# Patient Record
Sex: Female | Born: 2005 | Race: White | Hispanic: No | Smoking: Never smoker
Health system: Southern US, Community
[De-identification: ages and names within clinical notes are randomized; demographics above are authoritative.]

## PROBLEM LIST (undated history)

## (undated) ENCOUNTER — Inpatient Hospital Stay: Payer: Self-pay

## (undated) ENCOUNTER — Ambulatory Visit: Admission: EM | Source: Home / Self Care

## (undated) DIAGNOSIS — F32A Depression, unspecified: Secondary | ICD-10-CM

## (undated) DIAGNOSIS — F419 Anxiety disorder, unspecified: Secondary | ICD-10-CM

## (undated) HISTORY — DX: Anxiety disorder, unspecified: F41.9

## (undated) HISTORY — DX: Depression, unspecified: F32.A

## (undated) HISTORY — PX: TONSILLECTOMY: SUR1361

---
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
## 2023-02-12 ENCOUNTER — Ambulatory Visit: Payer: BC Managed Care – PPO

## 2023-02-12 NOTE — Progress Notes (Deleted)
    NURSE VISIT NOTE  Subjective:    Patient ID: Christina Butler, female    DOB: 2006-10-13, 17 y.o.   MRN: [id number]  HPI  Patient is a 17 y.o. No obstetric history on file. female who presents for depo provera injection.   Objective:    There were no vitals taken for this visit.  Last Annual: 11/20/2022. Last pap: N/A. Last Depo-Provera: 11/20/2022. Side Effects if any: none. Serum HCG indicated? No . Depo-Provera 150 mg IM given by: Sheliah Hatch, CMA. Site: Left Deltoid  Lab Review  @THIS  VISIT ONLY@  Assessment:   No diagnosis found.   Plan:   Next appointment due between June 25 and July 9th.    Fonda Kinder, CMA

## 2023-02-16 ENCOUNTER — Other Ambulatory Visit: Payer: Self-pay

## 2023-02-16 ENCOUNTER — Emergency Department
Admission: EM | Admit: 2023-02-16 | Discharge: 2023-02-16 | Disposition: A | Discharge status: Home / Self Care | Payer: BC Managed Care – PPO | Attending: Emergency Medicine | Admitting: Emergency Medicine

## 2023-02-16 DIAGNOSIS — N12 Tubulo-interstitial nephritis, not specified as acute or chronic: Secondary | ICD-10-CM | POA: Diagnosis not present

## 2023-02-16 DIAGNOSIS — R300 Dysuria: Secondary | ICD-10-CM | POA: Diagnosis not present

## 2023-02-16 LAB — URINALYSIS, ROUTINE W REFLEX MICROSCOPIC
Bilirubin Urine: NEGATIVE
Glucose, UA: NEGATIVE mg/dL
Hgb urine dipstick: NEGATIVE
Ketones, ur: NEGATIVE mg/dL
Leukocytes,Ua: NEGATIVE
Nitrite: POSITIVE — AB
Protein, ur: NEGATIVE mg/dL
Specific Gravity, Urine: 1.014 (ref 1.005–1.030)
pH: 5 (ref 5.0–8.0)

## 2023-02-16 MED ORDER — CEPHALEXIN 500 MG PO CAPS: 500.0000 mg | ORAL_CAPSULE | Freq: Four times a day (QID) | ORAL | 0 refills | Status: DC

## 2023-02-16 MED ORDER — CEFTRIAXONE SODIUM 1 G IJ SOLR
1.0000 g | Freq: Once | INTRAMUSCULAR | Status: AC
  Administered 2023-02-16: 1 g via INTRAMUSCULAR
  Filled 2023-02-16: qty 10

## 2023-02-16 NOTE — ED Triage Notes (Signed)
Pt to ED via POV c.o UTI and flank pain. Pt states she's had UTI for 5 days and has been taking Azo for it and it would take pain away but pain comes back. Pt now reporting left and right sided flank pain. Denies N/V/D. NAD at this time

## 2023-02-16 NOTE — ED Provider Notes (Signed)
[facility] Provider Note  Patient Contact: 10:13 PM (approximate)   History   Urinary Tract Infection and Flank Pain   HPI  Christina Butler is a 17 y.o. female who presents the emergency department with dysuria and flank pain.  Patient states that she has had recurrent UTIs.  She seen her OB/GYN in the past for same.  She denies any fevers, chills, emesis, diarrhea or constipation.  Started off with dysuria, she was using Azo with no relief and now she is starting to have flank pain.     Physical Exam   Triage Vital Signs: ED Triage Vitals  Enc Vitals Group     BP 02/16/23 2131 (!) 151/85     Pulse Rate 02/16/23 2131 86     Resp 02/16/23 2131 16     Temp 02/16/23 2131 98.9 F (37.2 C)     Temp Source 02/16/23 2131 Oral     SpO2 02/16/23 2131 100 %     Weight 02/16/23 2128 156 lb (70.8 kg)     Height 02/16/23 2128  (1.676 m)     Head Circumference --      Peak Flow --      Pain Score 02/16/23 2133 6     Pain Loc --      Pain Edu? --      Excl. in GC? --     Most recent vital signs: Vitals:   02/16/23 2131  BP: (!) 151/85  Pulse: 86  Resp: 16  Temp: 98.9 F (37.2 C)  SpO2: 100%     General: Alert and in no acute distress.  Cardiovascular:  Good peripheral perfusion Respiratory: Normal respiratory effort without tachypnea or retractions. Lungs CTAB.  Gastrointestinal: Bowel sounds 4 quadrants. Soft and nontender to palpation. No guarding or rigidity. No palpable masses. No distention.  Mild left-sided CVA tenderness. Musculoskeletal: Full range of motion to all extremities.  Neurologic:  No gross focal neurologic deficits are appreciated.  Skin:   No rash noted Other:   ED Results / Procedures / Treatments   Labs (all labs ordered are listed, but only abnormal results are displayed) Labs Reviewed  URINALYSIS, ROUTINE W REFLEX MICROSCOPIC - Abnormal; Notable for the following components:      Result Value   Color, Urine  AMBER (*)    APPearance CLEAR (*)    Nitrite POSITIVE (*)    Bacteria, UA RARE (*)    All other components within normal limits  POC URINE PREG, ED     EKG     RADIOLOGY    No results found.  PROCEDURES:  Critical Care performed: No  Procedures   MEDICATIONS ORDERED IN ED: Medications  cefTRIAXone (ROCEPHIN) injection 1 g (has no administration in time range)     IMPRESSION / MDM / ASSESSMENT AND PLAN / ED COURSE  I reviewed the triage vital signs and the nursing notes.                                 Differential diagnosis includes, but is not limited to, pyelonephritis, nephrolithiasis, UTI, ectopic, pregnant   Patient's presentation is most consistent with acute presentation with potential threat to life or bodily function.   Patient's diagnosis is consistent with UTI/pyelonephritis.  Patient presents emergency department dysuria.  She has a history of recurring UTIs and follows with her OB for same.  Patient has developed some left-sided  flank pain.  No fevers, emesis, other concerning signs are vital signs.  Patient has mild left-sided CVA tenderness.  Nitrite positive urine consistent with UTI.  Patient will be given a Rocephin shot here followed by Keflex at home.  Return precautions discussed with patient.  Follow-up primary care as needed..  Patient is given ED precautions to return to the ED for any worsening or new symptoms.     FINAL CLINICAL IMPRESSION(S) / ED DIAGNOSES   Final diagnoses:  Pyelonephritis     Rx / DC Orders   ED Discharge Orders          Ordered    cephALEXin (KEFLEX) 500 MG capsule  4 times daily        02/16/23 2217             Note:  This document was prepared using Dragon voice recognition software and may include unintentional dictation errors.   Lanette Hampshire 02/16/23 2217    Minna Antis, MD 02/17/23 2257

## 2023-02-18 ENCOUNTER — Other Ambulatory Visit: Payer: Self-pay

## 2023-02-18 ENCOUNTER — Emergency Department
Admission: EM | Admit: 2023-02-18 | Discharge: 2023-02-18 | Disposition: A | Discharge status: Home / Self Care | Payer: BC Managed Care – PPO | Attending: Emergency Medicine | Admitting: Emergency Medicine

## 2023-02-18 ENCOUNTER — Encounter: Payer: Self-pay | Admitting: Emergency Medicine

## 2023-02-18 DIAGNOSIS — R102 Pelvic and perineal pain: Secondary | ICD-10-CM | POA: Diagnosis not present

## 2023-02-18 DIAGNOSIS — E876 Hypokalemia: Secondary | ICD-10-CM | POA: Diagnosis not present

## 2023-02-18 LAB — WET PREP, GENITAL
Clue Cells Wet Prep HPF POC: NONE SEEN
Sperm: NONE SEEN
Trich, Wet Prep: NONE SEEN
WBC, Wet Prep HPF POC: >=10 — AB (ref <10)
Yeast Wet Prep HPF POC: NONE SEEN

## 2023-02-18 LAB — CBC WITH DIFFERENTIAL/PLATELET
Abs Immature Granulocytes: 0.01 10*3/uL (ref 0.00–0.07)
Basophils Absolute: 0 10*3/uL (ref 0.0–0.1)
Basophils Relative: 1 %
Eosinophils Absolute: 0.1 10*3/uL (ref 0.0–0.5)
Eosinophils Relative: 1 %
HCT: 35.7 % — ABNORMAL LOW (ref 36.0–46.0)
Hemoglobin: 12.6 g/dL (ref 12.0–15.0)
Immature Granulocytes: 0 %
Lymphocytes Relative: 53 %
Lymphs Abs: 3.3 10*3/uL (ref 0.7–4.0)
MCH: 30.5 pg (ref 26.0–34.0)
MCHC: 35.3 g/dL (ref 30.0–36.0)
MCV: 86.4 fL (ref 80.0–100.0)
Monocytes Absolute: 0.4 10*3/uL (ref 0.1–1.0)
Monocytes Relative: 7 %
Neutro Abs: 2.4 10*3/uL (ref 1.7–7.7)
Neutrophils Relative %: 38 %
Platelets: 187 10*3/uL (ref 150–400)
RBC: 4.13 MIL/uL (ref 3.87–5.11)
RDW: 11.9 % (ref 11.5–15.5)
WBC: 6.2 10*3/uL (ref 4.0–10.5)
nRBC: 0 % (ref 0.0–0.2)

## 2023-02-18 LAB — CHLAMYDIA/NGC RT PCR (ARMC ONLY)
Chlamydia Tr: NOT DETECTED
N gonorrhoeae: NOT DETECTED

## 2023-02-18 LAB — BASIC METABOLIC PANEL
Anion gap: 7 (ref 5–15)
BUN: 11 mg/dL (ref 6–20)
CO2: 24 mmol/L (ref 22–32)
Calcium: 9 mg/dL (ref 8.9–10.3)
Chloride: 106 mmol/L (ref 98–111)
Creatinine, Ser: 0.74 mg/dL (ref 0.44–1.00)
GFR, Estimated: >60 mL/min (ref >60)
Glucose, Bld: 90 mg/dL (ref 70–99)
Potassium: 3.4 mmol/L — ABNORMAL LOW (ref 3.5–5.1)
Sodium: 137 mmol/L (ref 135–145)

## 2023-02-18 MED ORDER — PHENAZOPYRIDINE HCL 95 MG PO TABS: 190.0000 mg | ORAL_TABLET | Freq: Three times a day (TID) | ORAL | 0 refills | Status: DC | PRN

## 2023-02-18 MED ORDER — PHENAZOPYRIDINE HCL 200 MG PO TABS
200.0000 mg | ORAL_TABLET | Freq: Once | ORAL | Status: AC
  Administered 2023-02-18: 200 mg via ORAL
  Filled 2023-02-18: qty 1

## 2023-02-18 MED ORDER — KETOROLAC TROMETHAMINE 30 MG/ML IJ SOLN
15.0000 mg | Freq: Once | INTRAMUSCULAR | Status: AC
  Administered 2023-02-18: 15 mg via INTRAVENOUS
  Filled 2023-02-18: qty 1

## 2023-02-18 NOTE — ED Triage Notes (Signed)
Pt states seen yesterday and dx with UTI, pt states received abx and a shot here and picked up her abx yesterday and started taking as prescribed. Pt states flank pain is improved, but UTI symtpoms are not any better if not worse. Pt A&O x4, NAD noted in triage.

## 2023-02-18 NOTE — ED Provider Notes (Signed)
[facility] Provider Note    Event Date/Time   First MD Initiated Contact with Patient 02/18/23 0402     (approximate)   History   Flank Pain   HPI Christina Butler is a 17 y.o. female who presents for persistent vulvar vaginal pain in the setting of a recent UTI diagnosis.  She was seen about 2 days ago in the emergency department and diagnosed with urinary tract infection, given a dose of ceftriaxone intramuscular, and filled her prescription for Keflex.  She has taken about 4 doses of Keflex, but she said that the pain "in the front", both when she urinates and when she does not, is excruciating.  She has not taken any Azo this time, but in the past she has taken Azo with good effect.  She was not sure if she was supposed to take it along with the antibiotics.  She says she is not having any additional flank pain and has not had any fever or persistent vomiting, it is just the pain in the genital region that is causing her much distress.     Physical Exam   Triage Vital Signs: ED Triage Vitals [02/18/23 0357]  Enc Vitals Group     BP 121/75     Pulse Rate 75     Resp 17     Temp 98.6 F (37 C)     Temp Source Oral     SpO2 99 %     Weight 70.8 kg (156 lb)     Height 1.676 m (5\' 6" )     Head Circumference      Peak Flow      Pain Score 8     Pain Loc      Pain Edu?      Excl. in GC?     Most recent vital signs: Vitals:   02/18/23 0357  BP: 121/75  Pulse: 75  Resp: 17  Temp: 98.6 F (37 C)  SpO2: 99%    General: Awake, no distress.  Well-appearing. CV:  Good peripheral perfusion.  Resp:  Normal effort. Speaking easily and comfortably, no accessory muscle usage nor intercostal retractions.   Abd:  No distention.  No tenderness to palpation other than some very mild tenderness in the suprapubic region.  No rebound or guarding. GU:  Normal external exam with no evidence of rash including herpetic lesions.  No tenderness to palpation along  the labia majora and labia minora.  No visible nor palpable abscesses no areas of induration or fluctuance.  Speculum exam is essentially normal with minimal discharge in the vaginal vault, normal-appearing cervix, and the patient had no cervical motion tenderness on bimanual exam.  ED chaperone present throughout exam Marchelle Folks, ED RN).   ED Results / Procedures / Treatments   Labs (all labs ordered are listed, but only abnormal results are displayed) Labs Reviewed  WET PREP, GENITAL - Abnormal; Notable for the following components:      Result Value   WBC, Wet Prep HPF POC >=10 (*)    All other components within normal limits  CBC WITH DIFFERENTIAL/PLATELET - Abnormal; Notable for the following components:   HCT 35.7 (*)    All other components within normal limits  BASIC METABOLIC PANEL - Abnormal; Notable for the following components:   Potassium 3.4 (*)    All other components within normal limits  CHLAMYDIA/NGC RT PCR ([facility] ONLY)  PROCEDURES:  Critical Care performed: No  Procedures    IMPRESSION / MDM / ASSESSMENT AND PLAN / ED COURSE  I reviewed the triage vital signs and the nursing notes.                              Differential diagnosis includes, but is not limited to, STD including herpes infection, persistent UTI with dysuria, yeast infection, Bartholin or other genital abscess, BV.  Patient's presentation is most consistent with acute presentation with potential threat to life or bodily function.  Labs/studies ordered: Wet prep, gonorrhea/chlamydia, BMP, CBC with differential.  Interventions/Medications given:  Medications  phenazopyridine (PYRIDIUM) tablet 200 mg (200 mg Oral Given 02/18/23 0530)  ketorolac (TORADOL) 30 MG/ML injection 15 mg (15 mg Intravenous Given 02/18/23 0530)   (Note:  hospital course my include additional interventions and/or labs/studies not listed above.)   I viewed and interpreted the patient's results from  yesterday.  She had a nitrite positive urinalysis and her urine culture is growing back Proteus which is pansensitive except for nitrofurantoin.  The cephalosporins that she has received should be appropriate treatment.  However, she is reporting more vulvovaginal pain than flank pain or suprapubic pain.  We performed a pelvic exam which was generally reassuring and I have a low suspicion for STD.  The patient knows to check up-to-date for the gonorrhea and Chlamydia results.  The main plan is to check the wet prep, but fortunately she has no evidence of herpes at this time.  Labs are all essentially normal and there is no indication to repeat the urinalysis.     Clinical Course as of 02/18/23 0622  Mon Feb 18, 2023  0607 Patient's wet prep is very reassuring with no specific abnormalities.  I reassessed her and she says she is feeling much better after the Pyridium and Toradol.  I recommended continued use of her previously prescribed antibiotics and I gave her a new prescription for Pyridium.  Also recommended use of ibuprofen and Tylenol.  She does not have a primary care doctor so I referred her to the PCP system and give her number for [facility].  I gave my usual and customary return precautions.  She understands and agrees with the plan. [CF]    Clinical Course User Index [CF] Loleta Rose, MD     FINAL CLINICAL IMPRESSION(S) / ED DIAGNOSES   Final diagnoses:  Vulvovaginal pain     Rx / DC Orders   ED Discharge Orders          Ordered    Ambulatory Referral to Primary Care (Establish Care)        02/18/23 0609    phenazopyridine (PYRIDIUM) 95 MG tablet  3 times daily with meals PRN        02/18/23 0609             Note:  This document was prepared using Dragon voice recognition software and may include unintentional dictation errors.   Loleta Rose, MD 02/18/23 [phone]

## 2023-02-18 NOTE — Discharge Instructions (Signed)
Your evaluation tonight was generally reassuring.  Please continue taking your previously prescribed antibiotics, in addition to the new prescription for Pyridium that we provided tonight.  We recommend that you follow-up with a primary care provider and you have been referred to the [facility] health primary care system.  You can also follow-up with a phone call to [facility] to establish primary care.  You may also choose to follow-up with the provider who is seeing you I am prescribing your birth control.  Return to the emergency department if you develop new or worsening symptoms that concern you.

## 2023-02-20 ENCOUNTER — Ambulatory Visit (INDEPENDENT_AMBULATORY_CARE_PROVIDER_SITE_OTHER): Payer: BC Managed Care – PPO

## 2023-02-20 VITALS — BP 108/73 | HR 103 | Ht 66.0 in | Wt 165.0 lb

## 2023-02-20 DIAGNOSIS — Z3042 Encounter for surveillance of injectable contraceptive: Secondary | ICD-10-CM

## 2023-02-20 MED ORDER — MEDROXYPROGESTERONE ACETATE 150 MG/ML IM SUSY
150.0000 mg | PREFILLED_SYRINGE | Freq: Once | INTRAMUSCULAR | Status: AC
  Administered 2023-02-20: 150 mg via INTRAMUSCULAR

## 2023-02-20 NOTE — Progress Notes (Addendum)
    NURSE VISIT NOTE  Subjective:    Patient ID: Christina Butler, female    DOB: 09/27/2006, 17 y.o.   MRN: [id number]  HPI  Patient is a 17 y.o. No obstetric history on file. female who presents for depo provera injection.   Objective:    Last Annual: is due advised pt to make apt at check out . Last pap: N/A due to age. Last Depo-Provera: 11/20/2022. Side Effects if any: none. Serum HCG indicated? No . Depo-Provera 150 mg IM given by: Beverely Pace, CMA. Site: left deltoid    Assessment:   1. Encounter for management and injection of depo-Provera      Plan:   Next appointment due between July 3 and 17.    Loney Laurence, CMA

## 2023-02-21 NOTE — Addendum Note (Signed)
Addended by: Loney Laurence on: 02/21/2023 01:45 PM   Modules accepted: Level of Service

## 2023-03-05 ENCOUNTER — Ambulatory Visit
Admission: EM | Admit: 2023-03-05 | Discharge: 2023-03-05 | Disposition: A | Discharge status: Home / Self Care | Payer: BC Managed Care – PPO | Attending: Nurse Practitioner | Admitting: Nurse Practitioner

## 2023-03-05 ENCOUNTER — Other Ambulatory Visit: Payer: Self-pay

## 2023-03-05 DIAGNOSIS — M549 Dorsalgia, unspecified: Secondary | ICD-10-CM | POA: Diagnosis not present

## 2023-03-05 MED ORDER — IBUPROFEN 800 MG PO TABS: 800.0000 mg | ORAL_TABLET | Freq: Three times a day (TID) | ORAL | 0 refills | Status: DC | PRN

## 2023-03-05 NOTE — Discharge Instructions (Signed)
You may take ibuprofen every 8 hours as needed.  Please take this medication with food. You may use previously prescribed muscle relaxers as needed.  Please note that these medications can make you tired.  Do not drink alcohol or drive on these medications Heat to the back Rest Follow-up with your PCP if your symptoms do not improve Please go to the emergency room if you develop any worsening symptoms

## 2023-03-05 NOTE — ED Provider Notes (Signed)
[facility]    CSN: [id number] Arrival date & time: 03/05/23  1817      History   Chief Complaint Chief Complaint  Patient presents with   Back Pain    HPI Christina Butler is a 17 y.o. female presents for evaluation of back pain.  Patient reports today she developed some upper back pain that has been intermittent and does not radiate.  Describes it as a spasming type pain.  Denies any known injury or inciting event.  Denies any dysuria or hematuria.  She was recently treated for pyelonephritis on April 13 with Keflex.  She completed as prescribed and has complete resolution of symptoms.  Denies any history of back injuries or surgeries in the past.  She has not taken any OTC medications for her symptoms.  No other concerns at this time.   Back Pain   Past Medical History:  Diagnosis Date   Anxiety    Depression     Patient Active Problem List   Diagnosis Date Noted   Dyslipidemia 09/05/2022   Gastroenteritis 07/24/2022   Encounter for screening for HIV 07/24/2022   Need for influenza vaccination 07/24/2022   Abdominal bloating 07/24/2022   Anxiety 07/04/2021   Depression 07/04/2021   Bipolar disorder (HCC) 07/04/2021    Past Surgical History:  Procedure Laterality Date   TONSILLECTOMY     TONSILLECTOMY      OB History   No obstetric history on file.      Home Medications    Prior to Admission medications   Medication Sig Start Date End Date Taking? Authorizing Provider  ibuprofen (ADVIL) 800 MG tablet Take 1 tablet (800 mg total) by mouth every 8 (eight) hours as needed (back pain). 03/05/23  Yes Radford Pax, NP  cephALEXin (KEFLEX) 500 MG capsule Take 1 capsule (500 mg total) by mouth 4 (four) times daily. 02/16/23   Cuthriell, Delorise Royals, PA-C  escitalopram (LEXAPRO) 10 MG tablet Take 5 mg by mouth daily. 08/11/22   [provider]  medroxyPROGESTERone (DEPO-PROVERA) 150 MG/ML injection Inject 1 mL (150 mg total) into the muscle every 3  (three) months. 06/04/22   Doreene Burke, CNM  naproxen (NAPROSYN) 500 MG tablet Take 1 tablet (500 mg total) by mouth 2 (two) times daily. 12/31/22   Shirlee Latch, PA-C  pantoprazole (PROTONIX) 40 MG tablet Take 1 tablet (40 mg total) by mouth daily. 12/20/22   Midge Minium, MD  phenazopyridine (PYRIDIUM) 95 MG tablet Take 2 tablets (190 mg total) by mouth 3 (three) times daily with meals as needed for pain (bladder pain/spasms). 02/18/23   Loleta Rose, MD    Family History Family History  Problem Relation Age of Onset   Diabetes Mother    Healthy Father     Social History Social History   Tobacco Use   Smoking status: Never    Passive exposure: Yes   Smokeless tobacco: Never   Tobacco comments:    father smokes outside and in the car with the windows down  Vaping Use   Vaping Use: Former  Substance Use Topics   Alcohol use: No   Drug use: Never     Allergies   Iodine   Review of Systems Review of Systems  Musculoskeletal:  Positive for back pain.     Physical Exam Triage Vital Signs ED Triage Vitals  Enc Vitals Group     BP 03/05/23 1925 116/74     Pulse Rate 03/05/23 1925 92  Resp 03/05/23 1925 20     Temp 03/05/23 1925 98.7 F (37.1 C)     Temp Source 03/05/23 1925 Oral     SpO2 03/05/23 1925 100 %     Weight --      Height --      Head Circumference --      Peak Flow --      Pain Score 03/05/23 1922 4     Pain Loc --      Pain Edu? --      Excl. in GC? --    No data found.  Updated Vital Signs BP 116/74   Pulse 92   Temp 98.7 F (37.1 C) (Oral)   Resp 20   LMP  (LMP Unknown) Comment: Depo shots  SpO2 100%   Visual Acuity Right Eye Distance:   Left Eye Distance:   Bilateral Distance:    Right Eye Near:   Left Eye Near:    Bilateral Near:     Physical Exam Vitals and nursing note reviewed.  Constitutional:      General: She is not in acute distress.    Appearance: Normal appearance. She is not ill-appearing, toxic-appearing  or diaphoretic.  HENT:     Head: Normocephalic and atraumatic.  Eyes:     Pupils: Pupils are equal, round, and reactive to light.  Cardiovascular:     Rate and Rhythm: Normal rate.  Pulmonary:     Effort: Pulmonary effort is normal.  Abdominal:     Tenderness: There is no right CVA tenderness or left CVA tenderness.  Musculoskeletal:     Cervical back: Normal.     Thoracic back: Spasms and tenderness present. No swelling, edema, deformity, signs of trauma, lacerations or bony tenderness. Normal range of motion. No scoliosis.     Lumbar back: Normal.       Back:  Skin:    General: Skin is warm and dry.  Neurological:     General: No focal deficit present.     Mental Status: She is alert and oriented to person, place, and time.  Psychiatric:        Mood and Affect: Mood normal.        Behavior: Behavior normal.      UC Treatments / Results  Labs (all labs ordered are listed, but only abnormal results are displayed) Labs Reviewed - No data to display  Basic metabolic panel Order: [id number] Status: Final result     Visible to patient: Yes (seen)     Next appt: 05/15/2023 at 04:15 PM in Obstetrics and Gynecology [facility])   0 Result Notes         Component Ref Range & Units 2 wk ago (02/18/23) 4 mo ago (10/07/22) 7 mo ago (07/24/22) 7 mo ago (07/12/22) 8 mo ago (06/17/22) 1 yr ago (10/18/21)  Sodium 135 - 145 mmol/L 137 141 139 R 142 139 137  Potassium 3.5 - 5.1 mmol/L 3.4 Low  3.5 4.1 R 3.9 3.7 4.1  Chloride 98 - 111 mmol/L 106 112 High  107 R 113 High  110 105  CO2 22 - 32 mmol/L 24 24 21  R 24 22 25   Glucose, Bld 70 - 99 mg/dL 90 88 CM 83 R, CM 244 High  CM 103 High  CM 89 CM  Comment: Glucose reference range applies only to samples taken after fasting for at least 8 hours.  BUN 6 - 20 mg/dL 11 15 10  R 12 R 12 R 9  R  Creatinine, Ser 0.44 - 1.00 mg/dL 6.96 2.95 2.84 R, CM 1.32 R 0.58 R 0.64 R  Calcium 8.9 - 10.3 mg/dL 9.0 9.4 9.4 R 9.0 8.9 9.3  GFR,  Estimated >60 mL/min >60 >60 CM  NOT CALCULATED CM NOT CALCULATED CM NOT CALCULATED CM  Comment: (NOTE) Calculated using the CKD-EPI Creatinine Equation (2021)  Anion gap 5 - 15 7 5  CM  5 CM 7 CM 7 CM  Comment: Performed at [facility], [address] [place], [place] [address]  Resulting Agency [facility]         Specimen Collected: 02/18/23 04:16 Last Resulted: 02/18/23 04:42        EKG   Radiology No results found.  Procedures Procedures (including critical care time)  Medications Ordered in UC Medications - No data to display  Initial Impression / Assessment and Plan / UC Course  I have reviewed the triage vital signs and the nursing notes.  Pertinent labs & imaging results that were available during my care of the patient were reviewed by me and considered in my medical decision making (see chart for details).     Reviewed ER note from April as well as recent labs. Discussed exam and symptoms with patient.  Symptoms consistent with musculoskeletal cause.  No flank pain or dysuria. Trial of ibuprofen Patient states she has muscle relaxer left over from a previous issue and will take as needed.  I did remind her of the  side effect profile of these medications and she verbalized understanding Heat to the back as needed Rest PCP follow-up if symptoms do not improve ER precautions reviewed and patient verbalized understanding Final Clinical Impressions(s) / UC Diagnoses   Final diagnoses:  Upper back pain     Discharge Instructions      You may take ibuprofen every 8 hours as needed.  Please take this medication with food. You may use previously prescribed muscle relaxers as needed.  Please note that these medications can make you tired.  Do not drink alcohol or drive on these medications Heat to the back Rest Follow-up with your PCP if your symptoms do not  improve Please go to the emergency room if you develop any worsening symptoms   ED Prescriptions     Medication Sig Dispense Auth. Provider   ibuprofen (ADVIL) 800 MG tablet Take 1 tablet (800 mg total) by mouth every 8 (eight) hours as needed (back pain). 15 tablet Radford Pax, NP      PDMP not reviewed this encounter.   Radford Pax, NP 03/05/23 1946

## 2023-03-05 NOTE — ED Triage Notes (Signed)
Mid back pain that started today

## 2023-03-17 ENCOUNTER — Other Ambulatory Visit: Payer: Self-pay | Admitting: Gastroenterology

## 2023-03-20 ENCOUNTER — Encounter: Payer: Self-pay | Admitting: Emergency Medicine

## 2023-03-20 ENCOUNTER — Emergency Department: Payer: BC Managed Care – PPO

## 2023-03-20 ENCOUNTER — Other Ambulatory Visit: Payer: Self-pay

## 2023-03-20 ENCOUNTER — Emergency Department
Admission: EM | Admit: 2023-03-20 | Discharge: 2023-03-20 | Disposition: A | Discharge status: Home / Self Care | Payer: BC Managed Care – PPO | Attending: Emergency Medicine | Admitting: Emergency Medicine

## 2023-03-20 DIAGNOSIS — R1110 Vomiting, unspecified: Secondary | ICD-10-CM | POA: Diagnosis not present

## 2023-03-20 DIAGNOSIS — R109 Unspecified abdominal pain: Secondary | ICD-10-CM | POA: Insufficient documentation

## 2023-03-20 DIAGNOSIS — D72829 Elevated white blood cell count, unspecified: Secondary | ICD-10-CM | POA: Diagnosis not present

## 2023-03-20 DIAGNOSIS — R1030 Lower abdominal pain, unspecified: Secondary | ICD-10-CM | POA: Diagnosis not present

## 2023-03-20 LAB — URINALYSIS, ROUTINE W REFLEX MICROSCOPIC
Bilirubin Urine: NEGATIVE
Glucose, UA: NEGATIVE mg/dL
Ketones, ur: NEGATIVE mg/dL
Leukocytes,Ua: NEGATIVE
Nitrite: NEGATIVE
Protein, ur: NEGATIVE mg/dL
Specific Gravity, Urine: 1.023 (ref 1.005–1.030)
pH: 6 (ref 5.0–8.0)

## 2023-03-20 LAB — CBC
HCT: 43.1 % (ref 36.0–46.0)
Hemoglobin: 14.8 g/dL (ref 12.0–15.0)
MCH: 30.1 pg (ref 26.0–34.0)
MCHC: 34.3 g/dL (ref 30.0–36.0)
MCV: 87.8 fL (ref 80.0–100.0)
Platelets: 232 10*3/uL (ref 150–400)
RBC: 4.91 MIL/uL (ref 3.87–5.11)
RDW: 11.9 % (ref 11.5–15.5)
WBC: 14.8 10*3/uL — ABNORMAL HIGH (ref 4.0–10.5)
nRBC: 0 % (ref 0.0–0.2)

## 2023-03-20 LAB — BASIC METABOLIC PANEL
Anion gap: 9 (ref 5–15)
BUN: 9 mg/dL (ref 6–20)
CO2: 22 mmol/L (ref 22–32)
Calcium: 9.4 mg/dL (ref 8.9–10.3)
Chloride: 107 mmol/L (ref 98–111)
Creatinine, Ser: 0.75 mg/dL (ref 0.44–1.00)
GFR, Estimated: >60 mL/min (ref >60)
Glucose, Bld: 98 mg/dL (ref 70–99)
Potassium: 3.7 mmol/L (ref 3.5–5.1)
Sodium: 138 mmol/L (ref 135–145)

## 2023-03-20 LAB — POC URINE PREG, ED: Preg Test, Ur: NEGATIVE

## 2023-03-20 NOTE — ED Triage Notes (Signed)
Patient to ED via POV for right sided flank pain for approx 1 hour with and episode of vomiting. Family hx of kidney stones.

## 2023-03-20 NOTE — Discharge Instructions (Signed)
Your CT does not show any acute abnormality.  No kidney stone is noted.  If you felt increasing pain or if you have fever, chills or vomiting please return emergency department

## 2023-03-20 NOTE — ED Provider Notes (Signed)
[facility] Provider Note    Event Date/Time   First MD Initiated Contact with Patient 03/20/23 1430     (approximate)   History   Flank Pain (/)   HPI  Anallely Privett is a 17 y.o. female with no significant past medical history presents emergency department with right-sided flank pain.  Patient states she was in the shower and had sharp shooting pain under the right rib but shot around into her abdomen.  Made her vomit secondary to the pain.  Family history of kidney stones.  She has not noticed any blood in her urine.  States she is on birth control pills and is not pregnant.      Physical Exam   Triage Vital Signs: ED Triage Vitals [03/20/23 1317]  Enc Vitals Group     BP 135/85     Pulse Rate (!) 105     Resp 18     Temp 98.5 F (36.9 C)     Temp Source Oral     SpO2 95 %     Weight      Height      Head Circumference      Peak Flow      Pain Score 9     Pain Loc      Pain Edu?      Excl. in GC?     Most recent vital signs: Vitals:   03/20/23 1317  BP: 135/85  Pulse: (!) 105  Resp: 18  Temp: 98.5 F (36.9 C)  SpO2: 95%     General: Awake, no distress.   CV:  Good peripheral perfusion. regular rate and  rhythm Resp:  Normal effort. Lungs cta Abd:  No distention.  Positive CVA tenderness on the right, abdomen is nontender Other:      ED Results / Procedures / Treatments   Labs (all labs ordered are listed, but only abnormal results are displayed) Labs Reviewed  URINALYSIS, ROUTINE W REFLEX MICROSCOPIC - Abnormal; Notable for the following components:      Result Value   Color, Urine YELLOW (*)    APPearance HAZY (*)    Hgb urine dipstick MODERATE (*)    Bacteria, UA RARE (*)    All other components within normal limits  CBC - Abnormal; Notable for the following components:   WBC 14.8 (*)    All other components within normal limits  BASIC METABOLIC PANEL  POC URINE PREG, ED     EKG     RADIOLOGY CT  renal stone    PROCEDURES:   Procedures   MEDICATIONS ORDERED IN ED: Medications - No data to display   IMPRESSION / MDM / ASSESSMENT AND PLAN / ED COURSE  I reviewed the triage vital signs and the nursing notes.                              Differential diagnosis includes, but is not limited to, kidney stone, pyelonephritis, acute cholecystitis  Patient's presentation is most consistent with acute presentation with potential threat to life or bodily function.   Patient with elevated WBC of 14, basic metabolic panel was reassuring, POC pregnancy negative, urinalysis is reassuring  CT renal stone study, I did independently review and interpret the radiologist reading, I interpret this as being negative for any acute abnormality  I did explain the findings to the patient.  Abdomen continues to be nontender.  She states  pain has started to subside.  I did offer pain medication but she refuses at this time.  She is to follow-up with her regular doctor if not improving 3 days.  Return emergency department worsening.  She is in agreement treatment plan.  Was discharged stable condition with a work note.      FINAL CLINICAL IMPRESSION(S) / ED DIAGNOSES   Final diagnoses:  Right flank pain     Rx / DC Orders   ED Discharge Orders     None        Note:  This document was prepared using Dragon voice recognition software and may include unintentional dictation errors.    Faythe Ghee, PA-C 03/20/23 1633    Trinna Post, MD 03/20/23 1919

## 2023-05-15 ENCOUNTER — Ambulatory Visit: Payer: BC Managed Care – PPO

## 2023-05-15 NOTE — Progress Notes (Deleted)
    NURSE VISIT NOTE  Subjective:    Patient ID: Christina Butler, female    DOB: [birth year], 17 y.o.   MRN: [id number]  HPI  Patient is a 17 y.o. No obstetric history on file. female who presents for depo provera injection.   Objective:    There were no vitals taken for this visit.  Last Annual: 06/04/22. Last pap: n/a. Last Depo-Provera: 02/20/23. Side Effects if any: none***. Serum HCG indicated? No . Depo-Provera 150 mg IM given by: Cornelius Moras, CMA. Site: {[facility] INJ [id number]  Lab Review  @THIS  VISIT ONLY@  Assessment:   No diagnosis found.   Plan:   Next appointment due between *** and ***.    Cornelius Moras, CMA

## 2023-05-27 DIAGNOSIS — R112 Nausea with vomiting, unspecified: Secondary | ICD-10-CM | POA: Diagnosis not present

## 2023-05-27 DIAGNOSIS — Z91041 Radiographic dye allergy status: Secondary | ICD-10-CM | POA: Diagnosis not present

## 2023-05-27 DIAGNOSIS — R197 Diarrhea, unspecified: Secondary | ICD-10-CM | POA: Diagnosis not present

## 2023-05-30 DIAGNOSIS — Z91041 Radiographic dye allergy status: Secondary | ICD-10-CM | POA: Diagnosis not present

## 2023-05-30 DIAGNOSIS — S81851A Open bite, right lower leg, initial encounter: Secondary | ICD-10-CM | POA: Diagnosis not present

## 2023-05-30 DIAGNOSIS — Y92830 Public park as the place of occurrence of the external cause: Secondary | ICD-10-CM | POA: Diagnosis not present

## 2023-05-30 DIAGNOSIS — W540XXA Bitten by dog, initial encounter: Secondary | ICD-10-CM | POA: Diagnosis not present

## 2023-06-05 ENCOUNTER — Ambulatory Visit (INDEPENDENT_AMBULATORY_CARE_PROVIDER_SITE_OTHER): Payer: BC Managed Care – PPO | Admitting: Certified Nurse Midwife

## 2023-06-05 ENCOUNTER — Encounter: Payer: Self-pay | Admitting: Certified Nurse Midwife

## 2023-06-05 VITALS — BP 126/77 | HR 58 | Ht 66.0 in | Wt 164.4 lb

## 2023-06-05 DIAGNOSIS — Z3202 Encounter for pregnancy test, result negative: Secondary | ICD-10-CM | POA: Diagnosis not present

## 2023-06-05 DIAGNOSIS — Z01419 Encounter for gynecological examination (general) (routine) without abnormal findings: Secondary | ICD-10-CM

## 2023-06-05 DIAGNOSIS — Z23 Encounter for immunization: Secondary | ICD-10-CM

## 2023-06-05 DIAGNOSIS — Z3042 Encounter for surveillance of injectable contraceptive: Secondary | ICD-10-CM | POA: Diagnosis not present

## 2023-06-05 LAB — POCT URINE PREGNANCY: Preg Test, Ur: NEGATIVE

## 2023-06-05 MED ORDER — MEDROXYPROGESTERONE ACETATE 150 MG/ML IM SUSP
150.0000 mg | Freq: Once | INTRAMUSCULAR | Status: AC
Start: 2023-06-05 — End: 2023-06-05
  Administered 2023-06-05: 150 mg via INTRAMUSCULAR

## 2023-06-05 MED ORDER — MEDROXYPROGESTERONE ACETATE 150 MG/ML IM SUSP: 150.0000 mg | INTRAMUSCULAR | 3 refills | Status: DC

## 2023-06-05 NOTE — Progress Notes (Signed)
GYNECOLOGY ANNUAL PREVENTATIVE CARE ENCOUNTER NOTE  History:     Christina Butler is a 17 y.o. No obstetric history on file. female here for a routine annual gynecologic exam.  Current complaints: nausea, she is seeing GI regarding this issue. States she has family history of IBS.   Denies abnormal vaginal bleeding, discharge, pelvic pain, problems with intercourse or other gynecologic concerns.     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 Patient has had an injection. Contraception: Depo-Provera injections Last Pap: n/a .  Last mammogram: n/a    Upstream - 06/05/23 1419       Pregnancy Intention Screening   Does the patient want to become pregnant in the next year? No    Does the patient's partner want to become pregnant in the next year? No    Would the patient like to discuss contraceptive options today? No      Contraception Wrap Up   Current Method Hormonal Injection    End Method Hormonal Injection    Contraception Counseling Provided No            The pregnancy intention screening data noted above was reviewed. Potential methods of contraception were discussed. The patient elected to proceed with Hormonal Injection.   Obstetric History OB History  No obstetric history on file.    Past Medical History:  Diagnosis Date   Anxiety    Depression     Past Surgical History:  Procedure Laterality Date   TONSILLECTOMY     TONSILLECTOMY      Current Outpatient Medications on File Prior to Visit  Medication Sig Dispense Refill   ibuprofen (ADVIL) 800 MG tablet Take 1 tablet (800 mg total) by mouth every 8 (eight) hours as needed (back pain). 15 tablet 0   medroxyPROGESTERone (DEPO-PROVERA) 150 MG/ML injection Inject 1 mL (150 mg total) into the muscle every 3 (three) months. 1 mL 2    pantoprazole (PROTONIX) 40 MG tablet TAKE 1 TABLET BY MOUTH EVERY DAY 90 tablet 1   cephALEXin (KEFLEX) 500 MG capsule Take 1 capsule (500 mg total) by mouth 4 (four) times daily. 28 capsule 0   escitalopram (LEXAPRO) 10 MG tablet Take 5 mg by mouth daily.     naproxen (NAPROSYN) 500 MG tablet Take 1 tablet (500 mg total) by mouth 2 (two) times daily. (Patient not taking: Reported on 06/05/2023) 30 tablet 0   phenazopyridine (PYRIDIUM) 95 MG tablet Take 2 tablets (190 mg total) by mouth 3 (three) times daily with meals as needed for pain (bladder pain/spasms). (Patient not taking: Reported on 06/05/2023) 12 tablet 0   No current facility-administered medications on file prior to visit.    Allergies  Allergen Reactions   Iodine Diarrhea and Nausea Only   Iodinated Contrast Media Nausea And Vomiting    Social History:  reports that she has never smoked. She has been exposed to tobacco smoke. She has never used smokeless tobacco. She reports that she does not drink alcohol and does not use drugs.  Family History  Problem Relation Age of Onset   Diabetes Mother    Healthy Father     The following portions of the patient's history were reviewed and updated as appropriate: allergies, current medications, past family history, past medical history, past social history, past surgical history and  problem list.  Review of Systems Pertinent items noted in HPI and remainder of comprehensive ROS otherwise negative.  Physical Exam:  BP 126/77   Pulse (!) 58   Ht 5\' 6"  (1.676 m)   Wt 164 lb 6.4 oz (74.6 kg)   BMI 26.53 kg/m  CONSTITUTIONAL: Well-developed, well-nourished female in no acute distress.  HENT:  Normocephalic, atraumatic, External right and left ear normal. Oropharynx is clear and moist EYES: Conjunctivae and EOM are normal. Pupils are equal, round, and reactive to light. No scleral icterus.  NECK: Normal range of motion, supple, no masses.  Normal thyroid.  SKIN: Skin is warm and  dry. No rash noted. Not diaphoretic. No erythema. No pallor. MUSCULOSKELETAL: Normal range of motion. No tenderness.  No cyanosis, clubbing, or edema.  2+ distal pulses. NEUROLOGIC: Alert and oriented to person, place, and time. Normal reflexes, muscle tone coordination.  PSYCHIATRIC: Normal mood and affect. Normal behavior. Normal judgment and thought content. CARDIOVASCULAR: Normal heart rate noted, regular rhythm RESPIRATORY: Clear to auscultation bilaterally. Effort and breath sounds normal, no problems with respiration noted. BREASTS: Symmetric in size. No masses, tenderness, skin changes, nipple drainage, or lymphadenopathy bilaterally.  ABDOMEN: Soft, no distention noted.  No tenderness, rebound or guarding.  PELVIC: Normal appearing external genitalia and urethral meatus; normal appearing vaginal mucosa and cervix.  No abnormal discharge noted.  Pap smear not indicated.  Normal uterine size, no other palpable masses, no uterine or adnexal tenderness.  .   Assessment and Plan:    1. Women's annual routine gynecological examination  Pap: not due  Mammogram : n/a  Labs: none Refills:depo provera  HPV vaccine:  second dose today Tdap today.  Referral:  none  Routine preventative health maintenance measures emphasized. Please refer to After Visit Summary for other counseling recommendations.      Doreene Burke, CNM [facility]  [facility],  [facility]

## 2023-07-09 DIAGNOSIS — Z91041 Radiographic dye allergy status: Secondary | ICD-10-CM | POA: Diagnosis not present

## 2023-07-09 DIAGNOSIS — K820 Obstruction of gallbladder: Secondary | ICD-10-CM | POA: Diagnosis not present

## 2023-07-09 DIAGNOSIS — R197 Diarrhea, unspecified: Secondary | ICD-10-CM | POA: Diagnosis not present

## 2023-07-09 DIAGNOSIS — R1013 Epigastric pain: Secondary | ICD-10-CM | POA: Diagnosis not present

## 2023-07-09 DIAGNOSIS — R1110 Vomiting, unspecified: Secondary | ICD-10-CM | POA: Diagnosis not present

## 2023-07-09 DIAGNOSIS — R1011 Right upper quadrant pain: Secondary | ICD-10-CM | POA: Diagnosis not present

## 2023-08-20 ENCOUNTER — Ambulatory Visit: Payer: BC Managed Care – PPO | Admitting: Gastroenterology

## 2023-08-20 ENCOUNTER — Encounter: Payer: Self-pay | Admitting: Gastroenterology

## 2023-08-20 VITALS — BP 116/75 | HR 91 | Temp 97.9°F | Ht 66.0 in | Wt 171.0 lb

## 2023-08-20 DIAGNOSIS — R112 Nausea with vomiting, unspecified: Secondary | ICD-10-CM

## 2023-08-20 MED ORDER — PANTOPRAZOLE SODIUM 40 MG PO TBEC: 40.0000 mg | DELAYED_RELEASE_TABLET | Freq: Two times a day (BID) | ORAL | 2 refills | Status: DC

## 2023-08-20 NOTE — Progress Notes (Signed)
Gastroenterology Consultation  Referring Provider:     [facility] Primary Care Physician:  Patient, No Pcp Per Primary Gastroenterologist:  Dr. Servando Snare     Reason for Consultation:     Nausea and vomiting        HPI:   Christina Butler is a 17 y.o. y/o female referred for consultation & management of nausea and vomiting by Dr. Patient, No Pcp Per.  This patient comes in today after being seen in the emergency department for nausea vomiting back in September.  The patient was also reporting abdominal pain.  She had reported at that at she had pain starting approximately 4 hours after eating crab legs with her family.  No other family members are sick and she reported that she was having right upper quadrant pain.  She also reported that she had 2 days of diarrhea that she thought was the result of birth control medication she was taking.  During that visit the patient had a right upper quadrant ultrasound that was normal. The patient reports that her nausea has been much better but still present.  She states that she started taking her pantoprazole at night but also works all day as a Production assistant, radio and then will eat few hours before she goes to sleep.  There is no report of any unexplained weight loss fevers or chills.   Past Medical History:  Diagnosis Date   Anxiety    Depression     Past Surgical History:  Procedure Laterality Date   TONSILLECTOMY     TONSILLECTOMY      Prior to Admission medications   Medication Sig Start Date End Date Taking? Authorizing Provider  cephALEXin (KEFLEX) 500 MG capsule Take 1 capsule (500 mg total) by mouth 4 (four) times daily. 02/16/23   Cuthriell, Delorise Royals, PA-C  escitalopram (LEXAPRO) 10 MG tablet Take 5 mg by mouth daily. 08/11/22   [provider]  ibuprofen (ADVIL) 800 MG tablet Take 1 tablet (800 mg total) by mouth every 8 (eight) hours as needed (back pain). 03/05/23   Radford Pax, NP  medroxyPROGESTERone (DEPO-PROVERA) 150  MG/ML injection Inject 1 mL (150 mg total) into the muscle every 3 (three) months. 06/05/23   Doreene Burke, CNM  naproxen (NAPROSYN) 500 MG tablet Take 1 tablet (500 mg total) by mouth 2 (two) times daily. Patient not taking: Reported on 06/05/2023 12/31/22   Eusebio Friendly B, PA-C  pantoprazole (PROTONIX) 40 MG tablet TAKE 1 TABLET BY MOUTH EVERY DAY 03/18/23   Midge Minium, MD  phenazopyridine (PYRIDIUM) 95 MG tablet Take 2 tablets (190 mg total) by mouth 3 (three) times daily with meals as needed for pain (bladder pain/spasms). Patient not taking: Reported on 06/05/2023 02/18/23   Loleta Rose, MD    Family History  Problem Relation Age of Onset   Diabetes Mother    Healthy Father      Social History   Tobacco Use   Smoking status: Never    Passive exposure: Yes   Smokeless tobacco: Never   Tobacco comments:    father smokes outside and in the car with the windows down  Vaping Use   Vaping status: Former  Substance Use Topics   Alcohol use: No   Drug use: Never    Allergies as of 08/20/2023 - Review Complete 08/20/2023  Allergen Reaction Noted   Iodine Diarrhea and Nausea Only 12/19/2006   Iodinated contrast media Nausea And Vomiting 05/27/2023    Review of  Systems:    All systems reviewed and negative except where noted in HPI.   Physical Exam:  BP 116/75 (BP Location: Right Arm, Patient Position: Sitting, Cuff Size: Normal)   Pulse 91   Temp 97.9 F (36.6 C) (Oral)   Ht 5\' 6"  (1.676 m)   Wt 171 lb (77.6 kg)   BMI 27.60 kg/m  No LMP recorded. Patient has had an injection. General:   Alert,  Well-developed, well-nourished, pleasant and cooperative in NAD Head:  Normocephalic and atraumatic. Eyes:  Sclera clear, no icterus.   Conjunctiva pink. Ears:  Normal auditory acuity. Neck:  Supple; no masses or thyromegaly. Neurologic:  Alert and oriented x3;  grossly normal neurologically. Skin:  Intact without significant lesions or rashes.  No jaundice. Lymph Nodes:  No  significant cervical adenopathy. Psych:  Alert and cooperative. Normal mood and affect.  Imaging Studies: No results found.  Assessment and Plan:   Christina Butler is a 17 y.o. y/o female who comes in today with a history of nausea and has had some improvement on a PPI.  The patient continues to have significant symptoms and she will be started on pantoprazole twice a day.  If this controls her symptoms she has been told that we then can consider sending her for antireflux surgery.  The patient has been explained the plan and agrees with it.    Midge Minium, MD. Clementeen Graham    Note: This dictation was prepared with Dragon dictation along with smaller phrase technology. Any transcriptional errors that result from this process are unintentional.

## 2023-08-29 ENCOUNTER — Ambulatory Visit: Payer: BC Managed Care – PPO

## 2023-09-03 ENCOUNTER — Ambulatory Visit: Payer: BC Managed Care – PPO | Admitting: Gastroenterology

## 2023-09-10 ENCOUNTER — Encounter: Payer: Self-pay | Admitting: Gastroenterology

## 2023-09-11 MED ORDER — AMITRIPTYLINE HCL 10 MG PO TABS: 10.0000 mg | ORAL_TABLET | Freq: Every day | ORAL | 1 refills | Status: DC

## 2023-09-11 NOTE — Addendum Note (Signed)
Addended by: Roena Malady on: 09/11/2023 09:14 AM   Modules accepted: Orders

## 2023-09-19 ENCOUNTER — Ambulatory Visit
Admission: EM | Admit: 2023-09-19 | Discharge: 2023-09-19 | Disposition: A | Discharge status: Home / Self Care | Payer: BC Managed Care – PPO | Attending: Emergency Medicine | Admitting: Emergency Medicine

## 2023-09-19 DIAGNOSIS — H9203 Otalgia, bilateral: Secondary | ICD-10-CM | POA: Diagnosis not present

## 2023-09-19 DIAGNOSIS — B349 Viral infection, unspecified: Secondary | ICD-10-CM | POA: Insufficient documentation

## 2023-09-19 LAB — GROUP A STREP BY PCR: Group A Strep by PCR: NOT DETECTED

## 2023-09-19 NOTE — ED Triage Notes (Signed)
Pt c/o sore throat & bilateral ear pain x2 days.

## 2023-09-19 NOTE — ED Provider Notes (Signed)
[facility]    CSN: [id number] Arrival date & time: 09/19/23  1146      History   Chief Complaint Chief Complaint  Patient presents with   Sore Throat   Otalgia    HPI Christina Butler is a 17 y.o. female.   17 year old female pt, Christina Butler, presents to urgent care for evaluation of sore throat and bilateral ear pain for 2 days.  Patient states her work is requiring a work note.  Patient is taking over-the-counter meds for management.  The history is provided by the patient. No language interpreter was used.    Past Medical History:  Diagnosis Date   Anxiety    Depression     Patient Active Problem List   Diagnosis Date Noted   Viral illness 09/19/2023   Otalgia of both ears 09/19/2023   Dyslipidemia 09/05/2022   Gastroenteritis 07/24/2022   Encounter for screening for HIV 07/24/2022   Need for influenza vaccination 07/24/2022   Abdominal bloating 07/24/2022   Anxiety 07/04/2021   Depression 07/04/2021   Bipolar disorder (HCC) 07/04/2021    Past Surgical History:  Procedure Laterality Date   TONSILLECTOMY     TONSILLECTOMY      OB History   No obstetric history on file.      Home Medications    Prior to Admission medications   Medication Sig Start Date End Date Taking? Authorizing Provider  amitriptyline (ELAVIL) 10 MG tablet Take 1 tablet (10 mg total) by mouth at bedtime. 09/11/23  Yes Midge Minium, MD  medroxyPROGESTERone (DEPO-PROVERA) 150 MG/ML injection Inject 1 mL (150 mg total) into the muscle every 3 (three) months. 06/05/23  Yes Doreene Burke, CNM  pantoprazole (PROTONIX) 40 MG tablet Take 1 tablet (40 mg total) by mouth 2 (two) times daily before a meal. 08/20/23  Yes Midge Minium, MD  simethicone (MYLICON) 125 MG chewable tablet Chew 125 mg by mouth every 6 (six) hours as needed for flatulence.   Yes [provider]    Family History Family History  Problem Relation Age of Onset   Diabetes Mother    Healthy  Father     Social History Social History   Tobacco Use   Smoking status: Never    Passive exposure: Yes   Smokeless tobacco: Never   Tobacco comments:    father smokes outside and in the car with the windows down  Vaping Use   Vaping status: Former  Substance Use Topics   Alcohol use: No   Drug use: Never     Allergies   Iodine and Iodinated contrast media   Review of Systems Review of Systems  Constitutional:  Negative for fever.  HENT:  Positive for ear pain and sore throat.   All other systems reviewed and are negative.    Physical Exam Triage Vital Signs ED Triage Vitals  Encounter Vitals Group     BP 09/19/23 1214 122/79     Systolic BP Percentile --      Diastolic BP Percentile --      Pulse Rate 09/19/23 1214 (!) 106     Resp 09/19/23 1214 16     Temp 09/19/23 1214 99.1 F (37.3 C)     Temp Source 09/19/23 1214 Oral     SpO2 09/19/23 1214 97 %     Weight 09/19/23 1213 165 lb (74.8 kg)     Height 09/19/23 1213 5\' 6"  (1.676 m)     Head Circumference --  Peak Flow --      Pain Score 09/19/23 1216 5     Pain Loc --      Pain Education --      Exclude from Growth Chart --    No data found.  Updated Vital Signs BP 122/79 (BP Location: Left Arm)   Pulse (!) 106   Temp 99.1 F (37.3 C) (Oral)   Resp 16   Ht 5\' 6"  (1.676 m)   Wt 165 lb (74.8 kg)   SpO2 97%   BMI 26.63 kg/m   Visual Acuity Right Eye Distance:   Left Eye Distance:   Bilateral Distance:    Right Eye Near:   Left Eye Near:    Bilateral Near:     Physical Exam Vitals and nursing note reviewed.  Constitutional:      General: She is not in acute distress.    Appearance: She is well-developed and well-groomed. She is not ill-appearing.  HENT:     Head: Normocephalic and atraumatic.     Right Ear: Tympanic membrane is retracted.     Left Ear: Tympanic membrane is retracted.     Nose: Nose normal.     Mouth/Throat:     Lips: Pink.     Mouth: Mucous membranes are moist.      Pharynx: Oropharynx is clear. Uvula midline.  Eyes:     Conjunctiva/sclera: Conjunctivae normal.  Cardiovascular:     Rate and Rhythm: Normal rate and regular rhythm.     Pulses: Normal pulses.     Heart sounds: Normal heart sounds. No murmur heard. Pulmonary:     Effort: Pulmonary effort is normal. No respiratory distress.     Breath sounds: Normal breath sounds and air entry.  Abdominal:     Palpations: Abdomen is soft.     Tenderness: There is no abdominal tenderness.  Musculoskeletal:        General: No swelling.     Cervical back: Neck supple.  Skin:    General: Skin is warm and dry.     Capillary Refill: Capillary refill takes less than 2 seconds.  Neurological:     General: No focal deficit present.     Mental Status: She is alert and oriented to person, place, and time.     GCS: GCS eye subscore is 4. GCS verbal subscore is 5. GCS motor subscore is 6.  Psychiatric:        Attention and Perception: Attention normal.        Mood and Affect: Mood normal.        Speech: Speech normal.        Behavior: Behavior normal. Behavior is cooperative.      UC Treatments / Results  Labs (all labs ordered are listed, but only abnormal results are displayed) Labs Reviewed  GROUP A STREP BY PCR    EKG   Radiology No results found.  Procedures Procedures (including critical care time)  Medications Ordered in UC Medications - No data to display  Initial Impression / Assessment and Plan / UC Course  I have reviewed the triage vital signs and the nursing notes.  Pertinent labs & imaging results that were available during my care of the patient were reviewed by me and considered in my medical decision making (see chart for details).  Clinical Course as of 09/19/23 1458  Thu Sep 19, 2023  1250 Strep negative [JD]    Clinical Course User Index [JD] Clancy Gourd, NP   Discussed  exam findings and plan of care with patient, strict go to ER precautions given.    Patient verbalized understanding to this provider.  Work note given.  Ddx: pharyngitis, otalgia, Viral illness,allergies Final Clinical Impressions(s) / UC Diagnoses   Final diagnoses:  Viral illness  Otalgia of both ears     Discharge Instructions      Your strep test is negative. Most likely you have a viral illness: no antibiotic as indicated at this time, May treat with OTC meds of choice. Make sure to drink plenty of fluids to stay hydrated(gatorade, water, popsicles,jello,etc), avoid caffeine products. Follow up with PCP. Return as needed.     ED Prescriptions   None    PDMP not reviewed this encounter.   Clancy Gourd, NP 09/19/23 [phone]

## 2023-09-19 NOTE — Discharge Instructions (Signed)
Your strep test is negative. Most likely you have a viral illness: no antibiotic as indicated at this time, May treat with OTC meds of choice. Make sure to drink plenty of fluids to stay hydrated(gatorade, water, popsicles,jello,etc), avoid caffeine products. Follow up with PCP. Return as needed.

## 2023-10-03 ENCOUNTER — Other Ambulatory Visit: Payer: Self-pay | Admitting: Gastroenterology

## 2023-11-01 DIAGNOSIS — R059 Cough, unspecified: Secondary | ICD-10-CM | POA: Diagnosis not present

## 2023-11-01 DIAGNOSIS — R1110 Vomiting, unspecified: Secondary | ICD-10-CM | POA: Diagnosis not present

## 2023-11-01 DIAGNOSIS — S0990XA Unspecified injury of head, initial encounter: Secondary | ICD-10-CM | POA: Diagnosis not present

## 2023-11-06 NOTE — L&D Delivery Note (Signed)
 Date of delivery: 10/11/24 Estimated Date of Delivery: 10/11/24 EGA: [redacted]w[redacted]d  Hospital Course summary: Christina Butler is a 18 y.o. G1P0 @ [redacted]w[redacted]d who was admitted on 10/11/2024 in early labor.   Delivery Note At 11:06 PM a viable female was delivered via Vaginal, Spontaneous (Presentation:      ).  APGAR: 9, 9; weight pending .   Placenta status: Spontaneous, Intact.  Cord: 3 vessels with the following complications: PPH .    Anesthesia: Epidural Episiotomy: None Lacerations: 2nd degree Suture Repair: 3.0 vicryl Est. Blood Loss (mL): 1300  Mom to postpartum.  Baby to Couplet care / Skin to Skin.  Christina Butler 10/12/2024, 12:03 AM    Delivery Narrative  Christina Butler was complete at 1953 and began pushing at 34. She delivered a vigorous female infant named Christina Butler at 2306. Apgars 9,9. The head followed by shoulders which were delivered without difficulty, and the rest of the body. Baby was immediately placed skin to skin on mother's chest. Loose Nuchal cord was noted and easily reduced before the birth of the body. Delayed cord clamping with cord clamped by CNM and cut by FOB. Cord blood was obtained. Placenta was delivered at 2313, primarily by maternal efforts and with some slight cord traction. Placenta was intact, Shultz mechanism, 3 VC, had some trailing membranes teased out with ringed forceps. Perineum inspected and found to have a second degree perineal laceration which was bleeding and required repair. Repaired with 3-0 vicryl in the usual fashion with epidural for anesthesia. Well tolerated by patient. AMSTL with IV pitocin  per unit protocol. EBL 1300, bleeding was primarily from laceration, no uterine atony was noted. Patient felt light headed and nausea upon completing repair, stat CBC ordered. After she ate some food she was feeling much better. Mother and baby stable.

## 2023-11-16 ENCOUNTER — Encounter: Payer: Self-pay | Admitting: Gastroenterology

## 2023-11-16 DIAGNOSIS — K219 Gastro-esophageal reflux disease without esophagitis: Secondary | ICD-10-CM

## 2023-11-18 MED ORDER — PANTOPRAZOLE SODIUM 40 MG PO TBEC: 40.0000 mg | DELAYED_RELEASE_TABLET | Freq: Two times a day (BID) | ORAL | 2 refills | Status: DC

## 2023-11-20 NOTE — Addendum Note (Signed)
 Addended by: Lovie Rudder on: 11/20/2023 11:21 AM   Modules accepted: Orders

## 2023-11-26 DIAGNOSIS — Z3202 Encounter for pregnancy test, result negative: Secondary | ICD-10-CM | POA: Diagnosis not present

## 2023-11-26 DIAGNOSIS — Z3200 Encounter for pregnancy test, result unknown: Secondary | ICD-10-CM | POA: Diagnosis not present

## 2023-11-26 DIAGNOSIS — Z91041 Radiographic dye allergy status: Secondary | ICD-10-CM | POA: Diagnosis not present

## 2023-12-02 ENCOUNTER — Encounter: Payer: Self-pay | Admitting: Surgery

## 2023-12-02 ENCOUNTER — Ambulatory Visit (INDEPENDENT_AMBULATORY_CARE_PROVIDER_SITE_OTHER): Payer: BC Managed Care – PPO | Admitting: Surgery

## 2023-12-02 VITALS — BP 117/80 | HR 90 | Temp 98.6°F | Ht 66.0 in | Wt 168.6 lb

## 2023-12-02 DIAGNOSIS — K219 Gastro-esophageal reflux disease without esophagitis: Secondary | ICD-10-CM

## 2023-12-02 MED ORDER — PREDNISONE 50 MG PO TABS: 50.0000 mg | ORAL_TABLET | Freq: Every day | ORAL | 0 refills | Status: DC

## 2023-12-02 NOTE — Patient Instructions (Addendum)
Your CT is scheduled for 12/05/2023 at 2:30 pm (arrivy by 12:15 pm) at [facility] on [address].  Your Barium Swallow is scheduled for 12/09/2023 9:30 am (arrive by 9:15 am) at [facility]. Nothing to eat 3 hours prior.   Prednisone has been sent to your pharmacy. Take 1 tablet 13 hours before, 1 tablet 7 hours before, and 1 tablet 1 hour before. Please pick up Benadryl 50 mg OTC and take 1 tablet 1 hour before.     GERD in Adults: Diet Changes  When you have gastroesophageal reflux disease (GERD), you may need to make changes to your diet. Choosing the right foods can help with your symptoms. Think about working with an expert in healthy eating called a dietitian. They can help you make healthy food choices. What are tips for following this plan? Reading food labels Look for foods that are low in saturated fat. Foods that may help with your symptoms include: Foods with less than 5% of daily value (DV) of fat. Foods with 0 grams of trans fat. Cooking [employer] in ways that don't use a lot of fat. These ways include: Baking. Steaming. Grilling. Broiling. To add flavor, try to use herbs that are low in spice and acidity. Avoid frying your food. Meal planning  Eat small meals often rather than eating 3 large meals each day. Eat your meals slowly in a place where you feel relaxed. If told by your health care provider, avoid: Foods that cause symptoms. Keep a food diary to keep track of foods that cause symptoms. Alcohol. Drinking a lot of liquid with meals. General instructions For 2-3 hours after you eat, avoid: Bending over. Exercise. Lying down. Chew sugar-free gum after meals. What foods should I eat? Eat a healthy diet. Try to include: Foods with high amounts of fiber. These include: Fruits and vegetables. Whole grains and beans. Low-fat dairy products. Lean meats, fish, and poultry. Egg whites. Foods that cause symptoms in someone else may not  cause symptoms for you. Work with your provider to find foods that are safe for you. The items listed above may not be all the foods and drinks you can have. Talk with a dietitian to learn more. The items listed above may not be a complete list of foods and beverages you can eat and drink. Contact a dietitian for more information. What foods should I avoid? Limiting some of these foods may help with your symptoms. Each person is different. Talk with a dietitian or your provider to help you find the exact foods to avoid. Some of the foods to avoid may include: Fruits Fruits with a lot of acid in them. These may include citrus fruits, such as oranges, grapefruit, pineapple, and lemons. Vegetables Deep-fried vegetables, such as [place] fries. Vegetables, sauces, or toppings made with added fat and vegetables with acid in them. These may include tomatoes and tomato products, chili peppers, onions, garlic, and horseradish. Grains Pastries or quick breads with added fat. Meats and other proteins High-fat meats, such as fatty beef or pork, hot dogs, ribs, ham, sausage, salami, and bacon. Fried meat or protein, such as fried fish and fried chicken. Egg yolks. Fats and oils Butter. Margarine. Shortening. Ghee. Drinks Coffee and other drinks with caffeine in them. Fizzy and sugary drinks, such as soda and energy drinks. Fruit juice made with acidic fruits, such as orange or grapefruit. Tomato juice. Sweets and desserts Chocolate and cocoa. Donuts. Seasonings and condiments Mint, such as peppermint  and spearmint. Condiments, herbs, or seasonings that cause symptoms. These may include curry, hot sauce, or vinegar-based salad dressings. The items listed above may not be all the foods and drinks you should avoid. Talk with a dietitian to learn more. Questions to ask your health care provider Changes to your diet and everyday life are often the first steps taken to manage symptoms of GERD. If these changes  don't help, talk with your provider about taking medicines. Where to find more information [employer] G[employer]intestinal [employer]: aboutgerd.org This information is not intended to replace advice given to you by your health care provider. Make sure you discuss any questions you have with your health care provider. Document Revised: 09/03/2023 Document Reviewed: 03/20/2023 Elsevier Patient Education  2024 [employer].

## 2023-12-03 NOTE — Progress Notes (Signed)
Patient ID: Christina Butler, female   DOB: Aug 26, 2006, 18 y.o.   MRN: [id number]  HPI Christina Butler is a 18 y.o. female seen in consultation at the request of Dr.Wohl for recalcitrant reflux. Initially she presented a few months ago with intractable nausea.  She had some episode of abdominal pain but the main thing was the regurgitation.  She endorses no dysphagia.  She has been placed on PPI without significant positive response. Nausea has not worsening with anything specific. Currently denies any abdominal pain. She Did have CT about 8 months ago for kidney stone and I have personally reviewed it, no evidence of acute intra-abdominal pathology.  May be very tiny sliding hiatal hernia although is not that obvious. She is able to perform more than 4 METS of activity without any shortness of breath or chest pain.  She used to do dance and ballet. Only prior surgery was a tonsillectomy.  Recent CBC and CMP was completely normal.  HPI  Past Medical History:  Diagnosis Date   Anxiety    Depression     Past Surgical History:  Procedure Laterality Date   TONSILLECTOMY     TONSILLECTOMY      Family History  Problem Relation Age of Onset   Diabetes Mother    Healthy Father     Social History Social History   Tobacco Use   Smoking status: Never    Passive exposure: Yes   Smokeless tobacco: Never   Tobacco comments:    father smokes outside and in the car with the windows down  Vaping Use   Vaping status: Former  Substance Use Topics   Alcohol use: No   Drug use: Never    Allergies  Allergen Reactions   Iodine Diarrhea and Nausea Only   Iodinated Contrast Media Nausea And Vomiting    Current Outpatient Medications  Medication Sig Dispense Refill   amitriptyline (ELAVIL) 10 MG tablet TAKE 1 TABLET BY MOUTH EVERYDAY AT BEDTIME 90 tablet 2   pantoprazole (PROTONIX) 40 MG tablet Take 1 tablet (40 mg total) by mouth 2 (two) times daily before a meal. 60 tablet 2    predniSONE (DELTASONE) 50 MG tablet Take 1 tablet (50 mg total) by mouth daily. 3 tablet 0   No current facility-administered medications for this visit.     Review of Systems Full ROS  was asked and was negative except for the information on the HPI  Physical Exam Blood pressure 117/80, pulse 90, temperature 98.6 F (37 C), temperature source Oral, height 5\' 6"  (1.676 m), weight 168 lb 9.6 oz (76.5 kg), SpO2 98%. CONSTITUTIONAL: NAD. EYES: Pupils are equal, round,  Sclera are non-icteric. EARS, NOSE, MOUTH AND THROAT: The oropharynx is clear. The oral mucosa is pink and moist. Hearing is intact to voice. LYMPH NODES:  Lymph nodes in the neck are normal. RESPIRATORY:  Lungs are clear. There is normal respiratory effort, with equal breath sounds bilaterally, and without pathologic use of accessory muscles. CARDIOVASCULAR: Heart is regular without murmurs, gallops, or rubs. GI: The abdomen is  soft, nontender, and nondistended. There are no palpable masses. There is no hepatosplenomegaly. There are normal bowel sounds in all quadrants. GU: Rectal deferred.   MUSCULOSKELETAL: Normal muscle strength and tone. No cyanosis or edema.   SKIN: Turgor is good and there are no pathologic skin lesions or ulcers. NEUROLOGIC: Motor and sensation is grossly normal. Cranial nerves are grossly intact. PSYCH:  Oriented to person, place and time. Affect is normal.  Data Reviewed  I have personally reviewed the patient's imaging, laboratory findings and medical records.    Assessment/Plan  18 year old female with recalcitrant reflux.  Discussed with her in detail regarding her disease process options of medical versus surgical management of reflux.  He seems to be interested in surgical correction of reflux disease.  I Discussed with her in detail about options of robotic Nissen fundoplication versus partial fundoplication.  Given that she wants to pursue that option I do think that is worth assessing her  intra-abdominal mediastinal anatomy with a CT scan of the abdomen and pelvis , we will perform barium swallow as well as refer back to GI for EGD. We talked regarding robotic Nissen fundoplication.  The risk the benefit and the possible implications.  We also talked about diet modification.  She continues to be interested in surgical approach. Copy of this report was sent to the referring provider    Sterling Big, MD FACS General Surgeon 12/03/2023, 3:48 PM

## 2023-12-05 ENCOUNTER — Ambulatory Visit
Admission: RE | Admit: 2023-12-05 | Discharge: 2023-12-05 | Disposition: A | Discharge status: Home / Self Care | Payer: BC Managed Care – PPO | Source: Ambulatory Visit | Attending: Surgery | Admitting: Surgery

## 2023-12-05 DIAGNOSIS — K219 Gastro-esophageal reflux disease without esophagitis: Secondary | ICD-10-CM | POA: Insufficient documentation

## 2023-12-05 DIAGNOSIS — K5900 Constipation, unspecified: Secondary | ICD-10-CM | POA: Diagnosis not present

## 2023-12-05 MED ORDER — DIPHENHYDRAMINE HCL 50 MG/ML IJ SOLN: 50.0000 mg | Freq: Once | INTRAMUSCULAR | Status: DC

## 2023-12-05 MED ORDER — IOHEXOL 300 MG/ML  SOLN
75.0000 mL | Freq: Once | INTRAMUSCULAR | Status: AC | PRN
  Administered 2023-12-05: 75 mL via INTRAVENOUS

## 2023-12-05 MED ORDER — IOHEXOL 9 MG/ML PO SOLN
500.0000 mL | ORAL | Status: AC
  Administered 2023-12-05 (×2): 500 mL via ORAL

## 2023-12-05 MED ORDER — DIPHENHYDRAMINE HCL 50 MG PO CAPS: 50.0000 mg | ORAL_CAPSULE | Freq: Once | ORAL | Status: DC

## 2023-12-05 MED ORDER — PREDNISONE 50 MG PO TABS: 50.0000 mg | ORAL_TABLET | Freq: Four times a day (QID) | ORAL | Status: DC

## 2023-12-08 DIAGNOSIS — R1032 Left lower quadrant pain: Secondary | ICD-10-CM | POA: Diagnosis not present

## 2023-12-08 DIAGNOSIS — R1031 Right lower quadrant pain: Secondary | ICD-10-CM | POA: Diagnosis not present

## 2023-12-08 DIAGNOSIS — K219 Gastro-esophageal reflux disease without esophagitis: Secondary | ICD-10-CM | POA: Diagnosis not present

## 2023-12-09 ENCOUNTER — Inpatient Hospital Stay: Admission: RE | Admit: 2023-12-09 | Payer: BC Managed Care – PPO | Source: Ambulatory Visit

## 2023-12-09 DIAGNOSIS — R1031 Right lower quadrant pain: Secondary | ICD-10-CM | POA: Diagnosis not present

## 2023-12-17 ENCOUNTER — Telehealth: Payer: Self-pay

## 2023-12-17 NOTE — Telephone Encounter (Signed)
-----   Message from [facility] sent at 12/17/2023  7:27 AM EST ----- Please let her know her CT was unremarkable ----- Message ----- From: Interface, Rad Results In Sent: 12/14/2023   7:54 AM EST To: Leafy Ro, MD

## 2023-12-17 NOTE — Telephone Encounter (Signed)
Message left for patient letting her know Dr Everlene Farrier said that her CT was unremarkable. She also will need to reschedule her swallow study that she missed. She may do this at [phone]. She may call the office with any questions.

## 2023-12-18 ENCOUNTER — Ambulatory Visit
Admission: EM | Admit: 2023-12-18 | Discharge: 2023-12-18 | Disposition: A | Discharge status: Home / Self Care | Payer: BC Managed Care – PPO | Attending: Family Medicine | Admitting: Family Medicine

## 2023-12-18 DIAGNOSIS — J00 Acute nasopharyngitis [common cold]: Secondary | ICD-10-CM | POA: Diagnosis not present

## 2023-12-18 LAB — SARS CORONAVIRUS 2 BY RT PCR: SARS Coronavirus 2 by RT PCR: NEGATIVE

## 2023-12-18 LAB — GROUP A STREP BY PCR: Group A Strep by PCR: NOT DETECTED

## 2023-12-18 NOTE — Discharge Instructions (Addendum)
Strep test and COVID tests are negative. You have a viral respiratory infection that will gradually improve over the next 7-10 days. Cough may last up to 3 weeks.    You can take Tylenol and/or Ibuprofen as needed for fever reduction and pain relief.    For cough: honey 1/2 to 1 teaspoon (you can dilute the honey in water or another fluid).  You can also use guaifenesin and dextromethorphan for cough. You can use a humidifier for chest congestion and cough.  If you don't have a humidifier, you can sit in the bathroom with the hot shower running.      For sore throat: try warm salt water gargles, Mucinex sore throat cough drops or cepacol lozenges, throat spray, warm tea or water with lemon/honey, popsicles or ice, or OTC cold relief medicine for throat discomfort. You can also purchase chloraseptic spray at the pharmacy or dollar store.   For congestion: take a daily anti-histamine like Zyrtec, Claritin, and a oral decongestant, such as pseudoephedrine.  You can also use Flonase 1-2 sprays in each nostril daily. Afrin is also a good option, if you do not have high blood pressure.    It is important to stay hydrated: drink plenty of fluids (water, gatorade/powerade/pedialyte, juices, or teas) to keep your throat moisturized and help further relieve irritation/discomfort.    Return or go to the Emergency Department if symptoms worsen or do not improve in the next few days

## 2023-12-18 NOTE — ED Provider Notes (Signed)
[facility]    CSN: [id number] Arrival date & time: 12/18/23  1317      History   Chief Complaint Chief Complaint  Patient presents with   Sore Throat   Ear Drainage   Headache   Facial Pain    HPI Christina Butler is a 18 y.o. female.   HPI  History obtained from the patient. Christina Butler presents for sore throat, headache, nasal congestion that started Sunday night.  Tmax 101 F.  Has ear pressure and history of recurrent ear infections. No vomiting or diarrhea.  Request note to return to work.       Past Medical History:  Diagnosis Date   Anxiety    Depression     Patient Active Problem List   Diagnosis Date Noted   Viral illness 09/19/2023   Otalgia of both ears 09/19/2023   Dyslipidemia 09/05/2022   Gastroenteritis 07/24/2022   Encounter for screening for HIV 07/24/2022   Need for influenza vaccination 07/24/2022   Abdominal bloating 07/24/2022   Anxiety 07/04/2021   Depression 07/04/2021   Bipolar disorder (HCC) 07/04/2021    Past Surgical History:  Procedure Laterality Date   TONSILLECTOMY     TONSILLECTOMY      OB History   No obstetric history on file.      Home Medications    Prior to Admission medications   Medication Sig Start Date End Date Taking? Authorizing Provider  amitriptyline (ELAVIL) 10 MG tablet TAKE 1 TABLET BY MOUTH EVERYDAY AT BEDTIME 10/09/23  Yes Midge Minium, MD  pantoprazole (PROTONIX) 40 MG tablet Take 1 tablet (40 mg total) by mouth 2 (two) times daily before a meal. 11/18/23  Yes Midge Minium, MD  predniSONE (DELTASONE) 50 MG tablet Take 1 tablet (50 mg total) by mouth daily. 12/02/23 12/01/24  Leafy Ro, MD    Family History Family History  Problem Relation Age of Onset   Diabetes Mother    Healthy Father     Social History Social History   Tobacco Use   Smoking status: Never    Passive exposure: Yes   Smokeless tobacco: Never   Tobacco comments:    father smokes outside and in the car with  the windows down  Vaping Use   Vaping status: Former  Substance Use Topics   Alcohol use: No   Drug use: Never     Allergies   Iodine and Iodinated contrast media   Review of Systems Review of Systems: negative unless otherwise stated in HPI.      Physical Exam Triage Vital Signs ED Triage Vitals  Encounter Vitals Group     BP 12/18/23 1434 108/65     Systolic BP Percentile --      Diastolic BP Percentile --      Pulse Rate 12/18/23 1434 (!) 110     Resp 12/18/23 1434 16     Temp 12/18/23 1434 98.4 F (36.9 C)     Temp Source 12/18/23 1434 Oral     SpO2 12/18/23 1434 97 %     Weight --      Height --      Head Circumference --      Peak Flow --      Pain Score 12/18/23 1433 4     Pain Loc --      Pain Education --      Exclude from Growth Chart --    No data found.  Updated Vital Signs BP 108/65 (BP Location:  Right Arm)   Pulse (!) 110   Temp 98.4 F (36.9 C) (Oral)   Resp 16   LMP 12/04/2023 (Exact Date)   SpO2 97%   Visual Acuity Right Eye Distance:   Left Eye Distance:   Bilateral Distance:    Right Eye Near:   Left Eye Near:    Bilateral Near:     Physical Exam GEN:     alert, non-toxic appearing female in no distress    HENT:  mucus membranes moist, oropharyngeal without lesions or erythema, no tonsillar hypertrophy or exudates, clear nasal discharge, bilateral TM normal EYES:   pupils equal and reactive, no scleral injection or discharge NECK:  normal ROM, no lymphadenopathy, no meningismus   RESP:  no increased work of breathing, clear to auscultation bilaterally CVS:   regular rate and rhythm Skin:   warm and dry, no rash on visible skin    UC Treatments / Results  Labs (all labs ordered are listed, but only abnormal results are displayed) Labs Reviewed  SARS CORONAVIRUS 2 BY RT PCR  GROUP A STREP BY PCR    EKG   Radiology No results found.  Procedures Procedures (including critical care time)  Medications Ordered in  UC Medications - No data to display  Initial Impression / Assessment and Plan / UC Course  I have reviewed the triage vital signs and the nursing notes.  Pertinent labs & imaging results that were available during my care of the patient were reviewed by me and considered in my medical decision making (see chart for details).       Pt is a 18 y.o. female who presents for 4 days of respiratory symptoms. Markell is afebrile here. Satting well on room air. Overall pt is non-toxic appearing, well hydrated, without respiratory distress. Pulmonary exam is unremarkable.  COVID and strep obtained and were negative. History most consistent with viral respiratory illness. Discussed symptomatic treatment.  Explained lack of efficacy of antibiotics in viral disease.  Typical duration of symptoms discussed.  Work note provided.  Return and ED precautions given and voiced understanding. Discussed MDM, treatment plan and plan for follow-up with patient who agrees with plan.     Final Clinical Impressions(s) / UC Diagnoses   Final diagnoses:  Acute nasopharyngitis     Discharge Instructions      Strep test and COVID tests are negative. You have a viral respiratory infection that will gradually improve over the next 7-10 days. Cough may last up to 3 weeks.    You can take Tylenol and/or Ibuprofen as needed for fever reduction and pain relief.    For cough: honey 1/2 to 1 teaspoon (you can dilute the honey in water or another fluid).  You can also use guaifenesin and dextromethorphan for cough. You can use a humidifier for chest congestion and cough.  If you don't have a humidifier, you can sit in the bathroom with the hot shower running.      For sore throat: try warm salt water gargles, Mucinex sore throat cough drops or cepacol lozenges, throat spray, warm tea or water with lemon/honey, popsicles or ice, or OTC cold relief medicine for throat discomfort. You can also purchase chloraseptic spray at  the pharmacy or dollar store.   For congestion: take a daily anti-histamine like Zyrtec, Claritin, and a oral decongestant, such as pseudoephedrine.  You can also use Flonase 1-2 sprays in each nostril daily. Afrin is also a good option, if you do not have  high blood pressure.    It is important to stay hydrated: drink plenty of fluids (water, gatorade/powerade/pedialyte, juices, or teas) to keep your throat moisturized and help further relieve irritation/discomfort.    Return or go to the Emergency Department if symptoms worsen or do not improve in the next few days      ED Prescriptions   None    PDMP not reviewed this encounter.   Katha Cabal, DO 12/18/23 1908

## 2023-12-18 NOTE — ED Triage Notes (Signed)
Sx x 4 days  Sore throat Bilateral ear drainage Headache Sinus pressure

## 2024-01-06 ENCOUNTER — Ambulatory Visit: Payer: BC Managed Care – PPO | Admitting: Surgery

## 2024-01-08 DIAGNOSIS — B3731 Acute candidiasis of vulva and vagina: Secondary | ICD-10-CM | POA: Diagnosis not present

## 2024-01-08 DIAGNOSIS — R102 Pelvic and perineal pain: Secondary | ICD-10-CM | POA: Diagnosis not present

## 2024-01-31 DIAGNOSIS — Z3200 Encounter for pregnancy test, result unknown: Secondary | ICD-10-CM | POA: Diagnosis not present

## 2024-01-31 DIAGNOSIS — Z3490 Encounter for supervision of normal pregnancy, unspecified, unspecified trimester: Secondary | ICD-10-CM | POA: Diagnosis not present

## 2024-02-03 DIAGNOSIS — Z5321 Procedure and treatment not carried out due to patient leaving prior to being seen by health care provider: Secondary | ICD-10-CM | POA: Diagnosis not present

## 2024-02-03 DIAGNOSIS — Z3490 Encounter for supervision of normal pregnancy, unspecified, unspecified trimester: Secondary | ICD-10-CM | POA: Diagnosis not present

## 2024-02-09 DIAGNOSIS — F419 Anxiety disorder, unspecified: Secondary | ICD-10-CM | POA: Diagnosis not present

## 2024-02-09 DIAGNOSIS — F319 Bipolar disorder, unspecified: Secondary | ICD-10-CM | POA: Diagnosis not present

## 2024-02-09 DIAGNOSIS — R102 Pelvic and perineal pain: Secondary | ICD-10-CM | POA: Diagnosis not present

## 2024-02-09 DIAGNOSIS — R109 Unspecified abdominal pain: Secondary | ICD-10-CM | POA: Diagnosis not present

## 2024-02-09 DIAGNOSIS — O99341 Other mental disorders complicating pregnancy, first trimester: Secondary | ICD-10-CM | POA: Diagnosis not present

## 2024-02-09 DIAGNOSIS — O219 Vomiting of pregnancy, unspecified: Secondary | ICD-10-CM | POA: Diagnosis not present

## 2024-02-09 DIAGNOSIS — Z3A01 Less than 8 weeks gestation of pregnancy: Secondary | ICD-10-CM | POA: Diagnosis not present

## 2024-02-09 DIAGNOSIS — O26891 Other specified pregnancy related conditions, first trimester: Secondary | ICD-10-CM | POA: Diagnosis not present

## 2024-02-10 ENCOUNTER — Telehealth: Payer: Self-pay

## 2024-02-17 ENCOUNTER — Ambulatory Visit

## 2024-02-17 ENCOUNTER — Emergency Department
Admission: EM | Admit: 2024-02-17 | Discharge: 2024-02-18 | Disposition: A | Discharge status: Home / Self Care | Attending: Emergency Medicine | Admitting: Emergency Medicine

## 2024-02-17 ENCOUNTER — Emergency Department

## 2024-02-17 ENCOUNTER — Other Ambulatory Visit: Payer: Self-pay

## 2024-02-17 VITALS — BP 118/70 | HR 76 | Ht 66.0 in | Wt 171.5 lb

## 2024-02-17 DIAGNOSIS — O26891 Other specified pregnancy related conditions, first trimester: Secondary | ICD-10-CM | POA: Diagnosis not present

## 2024-02-17 DIAGNOSIS — Z3480 Encounter for supervision of other normal pregnancy, unspecified trimester: Secondary | ICD-10-CM

## 2024-02-17 DIAGNOSIS — O200 Threatened abortion: Secondary | ICD-10-CM | POA: Diagnosis not present

## 2024-02-17 DIAGNOSIS — Z3A01 Less than 8 weeks gestation of pregnancy: Secondary | ICD-10-CM | POA: Diagnosis not present

## 2024-02-17 DIAGNOSIS — O219 Vomiting of pregnancy, unspecified: Secondary | ICD-10-CM

## 2024-02-17 DIAGNOSIS — Z3401 Encounter for supervision of normal first pregnancy, first trimester: Secondary | ICD-10-CM

## 2024-02-17 DIAGNOSIS — N83291 Other ovarian cyst, right side: Secondary | ICD-10-CM | POA: Diagnosis not present

## 2024-02-17 DIAGNOSIS — O3481 Maternal care for other abnormalities of pelvic organs, first trimester: Secondary | ICD-10-CM | POA: Diagnosis not present

## 2024-02-17 DIAGNOSIS — O26851 Spotting complicating pregnancy, first trimester: Secondary | ICD-10-CM | POA: Diagnosis not present

## 2024-02-17 LAB — POC URINE PREG, ED: Preg Test, Ur: POSITIVE — AB

## 2024-02-17 LAB — CBC WITH DIFFERENTIAL/PLATELET
Abs Immature Granulocytes: 0.01 10*3/uL (ref 0.00–0.07)
Basophils Absolute: 0 10*3/uL (ref 0.0–0.1)
Basophils Relative: 0 %
Eosinophils Absolute: 0.1 10*3/uL (ref 0.0–0.5)
Eosinophils Relative: 1 %
HCT: 38.6 % (ref 36.0–46.0)
Hemoglobin: 13.3 g/dL (ref 12.0–15.0)
Immature Granulocytes: 0 %
Lymphocytes Relative: 39 %
Lymphs Abs: 3.5 10*3/uL (ref 0.7–4.0)
MCH: 30.9 pg (ref 26.0–34.0)
MCHC: 34.5 g/dL (ref 30.0–36.0)
MCV: 89.8 fL (ref 80.0–100.0)
Monocytes Absolute: 0.7 10*3/uL (ref 0.1–1.0)
Monocytes Relative: 8 %
Neutro Abs: 4.8 10*3/uL (ref 1.7–7.7)
Neutrophils Relative %: 52 %
Platelets: 235 10*3/uL (ref 150–400)
RBC: 4.3 MIL/uL (ref 3.87–5.11)
RDW: 12.4 % (ref 11.5–15.5)
WBC: 9.1 10*3/uL (ref 4.0–10.5)
nRBC: 0 % (ref 0.0–0.2)

## 2024-02-17 LAB — URINALYSIS, ROUTINE W REFLEX MICROSCOPIC
Bilirubin Urine: NEGATIVE
Glucose, UA: NEGATIVE mg/dL
Hgb urine dipstick: NEGATIVE
Ketones, ur: NEGATIVE mg/dL
Leukocytes,Ua: NEGATIVE
Nitrite: NEGATIVE
Protein, ur: NEGATIVE mg/dL
Specific Gravity, Urine: 1.015 (ref 1.005–1.030)
pH: 7 (ref 5.0–8.0)

## 2024-02-17 LAB — HCG, QUANTITATIVE, PREGNANCY: hCG, Beta Chain, Quant, S: 34121 m[IU]/mL — ABNORMAL HIGH (ref <5)

## 2024-02-17 LAB — BASIC METABOLIC PANEL WITH GFR
Anion gap: 8 (ref 5–15)
BUN: 9 mg/dL (ref 6–20)
CO2: 22 mmol/L (ref 22–32)
Calcium: 9 mg/dL (ref 8.9–10.3)
Chloride: 108 mmol/L (ref 98–111)
Creatinine, Ser: 0.53 mg/dL (ref 0.44–1.00)
GFR, Estimated: >60 mL/min (ref >60)
Glucose, Bld: 88 mg/dL (ref 70–99)
Potassium: 4.1 mmol/L (ref 3.5–5.1)
Sodium: 138 mmol/L (ref 135–145)

## 2024-02-17 NOTE — ED Provider Notes (Signed)
 [facility] Provider Note    Event Date/Time   First MD Initiated Contact with Patient 02/17/24 2312     (approximate)   History   Vaginal Bleeding   HPI  Christina Butler is a 18 y.o. female G1 P0 approximately [redacted] weeks pregnant by dates who presents to the ED from home with a chief complaint of vaginal spotting times this afternoon.  Reports pelvic cramping throughout her pregnancy.  Seen at [facility] this morning to confirm pregnancy; no ultrasound done.  Denies fever/chills, chest pain, shortness of breath, vaginal discharge, dysuria, nausea/vomiting or diarrhea.  Last sexual intercourse 3 weeks ago.    Past Medical History   Past Medical History:  Diagnosis Date   Anxiety    Depression      Active Problem List   Patient Active Problem List   Diagnosis Date Noted   Viral illness 09/19/2023   Otalgia of both ears 09/19/2023   Dyslipidemia 09/05/2022   Gastroenteritis 07/24/2022   Encounter for screening for HIV 07/24/2022   Need for influenza vaccination 07/24/2022   Abdominal bloating 07/24/2022   Anxiety 07/04/2021   Depression 07/04/2021   Bipolar disorder (HCC) 07/04/2021     Past Surgical History   Past Surgical History:  Procedure Laterality Date   TONSILLECTOMY     TONSILLECTOMY       Home Medications   Prior to Admission medications   Medication Sig Start Date End Date Taking? Authorizing Provider  amitriptyline (ELAVIL) 10 MG tablet TAKE 1 TABLET BY MOUTH EVERYDAY AT BEDTIME Patient not taking: Reported on 02/17/2024 10/09/23   Midge Minium, MD  Doxylamine-Pyridoxine 10-10 MG TBEC Take 2 tablets by mouth at bedtime.    [provider]  pantoprazole (PROTONIX) 40 MG tablet Take 1 tablet (40 mg total) by mouth 2 (two) times daily before a meal. 11/18/23   Midge Minium, MD  predniSONE (DELTASONE) 50 MG tablet Take 1 tablet (50 mg total) by mouth daily. Patient not taking: Reported on 02/17/2024 12/02/23 12/01/24   Leafy Ro, MD     Allergies  Iodine and Iodinated contrast media   Family History   Family History  Problem Relation Age of Onset   Diabetes Mother    Healthy Father      Physical Exam  Triage Vital Signs: ED Triage Vitals [02/17/24 2129]  Encounter Vitals Group     BP 116/83     Systolic BP Percentile      Diastolic BP Percentile      Pulse Rate 100     Resp 18     Temp 98.9 F (37.2 C)     Temp Source Oral     SpO2 100 %     Weight 171 lb (77.6 kg)     Height 5\' 6"  (1.676 m)     Head Circumference      Peak Flow      Pain Score 1     Pain Loc      Pain Education      Exclude from Growth Chart     Updated Vital Signs: BP 116/83   Pulse 100   Temp 98.9 F (37.2 C) (Oral)   Resp 18   Ht 5\' 6"  (1.676 m)   Wt 77.6 kg   LMP 01/05/2024   SpO2 100%   BMI 27.60 kg/m    General: Awake, no distress.  CV:  RRR.  Good peripheral perfusion.  Resp:  Normal effort.  CTAB. Abd:  Nontender to light or deep palpation.  No distention.  Other:  No truncal vesicles.   ED Results / Procedures / Treatments  Labs (all labs ordered are listed, but only abnormal results are displayed) Labs Reviewed  HCG, QUANTITATIVE, PREGNANCY - Abnormal; Notable for the following components:      Result Value   hCG, Beta Chain, Quant, S 34,121 (*)    All other components within normal limits  URINALYSIS, ROUTINE W REFLEX MICROSCOPIC - Abnormal; Notable for the following components:   Color, Urine YELLOW (*)    APPearance CLEAR (*)    All other components within normal limits  POC URINE PREG, ED - Abnormal; Notable for the following components:   Preg Test, Ur POSITIVE (*)    All other components within normal limits  CBC WITH DIFFERENTIAL/PLATELET  BASIC METABOLIC PANEL WITH GFR  ABO/RH     EKG  None   RADIOLOGY I have independently visualized interpreted patient's imaging studies as well as noted the radiology interpretation:  {You MUST document your own  interpretation of imaging, as well as the fact that you reviewed the radiologist's report!:1}  Official radiology report(s): No results found.   PROCEDURES:  Critical Care performed: No  Procedures Pelvic deferred  MEDICATIONS ORDERED IN ED: Medications - No data to display   IMPRESSION / MDM / ASSESSMENT AND PLAN / ED COURSE  I reviewed the triage vital signs and the nursing notes.                             18 year old female G1 P0 approximately [redacted] weeks pregnant by dates presenting with vaginal spotting. Differential diagnosis includes, but is not limited to, ovarian cyst, ovarian torsion, acute appendicitis, diverticulitis, urinary tract infection/pyelonephritis, endometriosis, bowel obstruction, colitis, renal colic, gastroenteritis, hernia, fibroids, endometriosis, pregnancy related pain including ectopic pregnancy, etc. I personally reviewed patient's records and note her OB/GYN office visit from today.  Patient's presentation is most consistent with acute complicated illness / injury requiring diagnostic workup.  Laboratory results demonstrate normal hemoglobin 13.3, unremarkable electrolytes and urinalysis.  Beta hCG 34,121.  Awaiting ultrasound.      FINAL CLINICAL IMPRESSION(S) / ED DIAGNOSES   Final diagnoses:  Threatened miscarriage     Rx / DC Orders   ED Discharge Orders     None        Note:  This document was prepared using Dragon voice recognition software and may include unintentional dictation errors.

## 2024-02-17 NOTE — Progress Notes (Signed)
   GYN ENCOUNTER  Encounter for ED follow up  Subjective  HPI: Christina Butler is a 18 y.o. G1P0 who presents today for follow up after being seen in the ED for nausea and vomiting one week ago.   She was given Rx for Diclegis at ED visit and has been taking daily since that time. N/V well-controlled now. She endorses being able to eat and take her prenatal vitamin.   TVUS done in ED showed gestational sac too small for dating and no cardiac activity or yolk sac at that time. Beta hcg increased from 195 to 2575 from 3/31 to 4/6.   She denies vaginal bleeding.   Past Medical History:  Diagnosis Date   Anxiety    Depression    Past Surgical History:  Procedure Laterality Date   TONSILLECTOMY     TONSILLECTOMY     OB History     Gravida  1   Para      Term      Preterm      AB      Living         SAB      IAB      Ectopic      Multiple      Live Births             Allergies  Allergen Reactions   Iodine Diarrhea and Nausea Only   Iodinated Contrast Media Nausea And Vomiting    Review of Systems  12 point ROS negative except for pertinent positives noted in HPO above.   Objective  BP 118/70   Pulse 76   Ht 5\' 6"  (1.676 m)   Wt 171 lb 8 oz (77.8 kg)   LMP 01/05/2024   BMI 27.68 kg/m   Physical examination GENERAL APPEARANCE: alert, well appearing LUNGS: normal work of breathing HEART: normal heart rate  Assessment/Plan - Reviewed normal findings of ED US  for gestational age. Reviewed normal course of prenatal care including dating US  around 9-10 weeks.  - Advised to continue with Diclegis and daily PNV. Reviewed recommendations for healthy lifestyle and diet in pregnancy. Patient denies any significant health history.  - She is unsure of where she desires to get prenatal care at this time but is leaning towards delivery at [facility]. Will plan to schedule NOB intake, dating US ,  and NOB visit with us  for the time being .  Verita Glassman Chanele Douglas, CNM   02/17/24 2:17 PM

## 2024-02-17 NOTE — ED Triage Notes (Signed)
 Pt reports she is aprox [redacted] weeks pregnant and began to have small amount of brown vaginal bleeding. Pt also reports lower abd cramping that she has had intermittently throughout pregnancy. Pt seen at [facility] this morning to confirm pregnancy, states she has not had an US .

## 2024-02-18 DIAGNOSIS — O26851 Spotting complicating pregnancy, first trimester: Secondary | ICD-10-CM | POA: Diagnosis not present

## 2024-02-18 DIAGNOSIS — N83291 Other ovarian cyst, right side: Secondary | ICD-10-CM | POA: Diagnosis not present

## 2024-02-18 DIAGNOSIS — O3481 Maternal care for other abnormalities of pelvic organs, first trimester: Secondary | ICD-10-CM | POA: Diagnosis not present

## 2024-02-18 DIAGNOSIS — O26891 Other specified pregnancy related conditions, first trimester: Secondary | ICD-10-CM | POA: Diagnosis not present

## 2024-02-18 LAB — ABO/RH

## 2024-02-18 LAB — ANTIBODY SCREEN: Antibody Screen: NEGATIVE

## 2024-02-18 MED ORDER — RHO D IMMUNE GLOBULIN 1500 UNIT/2ML IJ SOSY
300.0000 ug | PREFILLED_SYRINGE | Freq: Once | INTRAMUSCULAR | Status: AC
  Administered 2024-02-18: 300 ug via INTRAMUSCULAR
  Filled 2024-02-18: qty 2

## 2024-02-18 NOTE — Discharge Instructions (Signed)
 Drink plenty of fluids daily.  Do not insert anything into the vagina.  Return to the ER for worsening symptoms, soaking more than 1 maxi pad per hour, fainting or other concerns.

## 2024-02-19 LAB — RHOGAM INJECTION: Unit division: 0

## 2024-03-05 NOTE — Progress Notes (Signed)
 New OB Intake  I connected with  Christina Butler on 03/06/24 at  3:15 PM EDT by in person visit.  I discussed the limitations, risks, security and privacy concerns of performing an evaluation and management service by telephone and the availability of in person appointments. I also discussed with the patient that there may be a patient responsible charge related to this service. The patient expressed understanding and agreed to proceed.  I explained I am completing New OB Intake today. We discussed her EDD of 10/11/24 that is based on LMP of 01/05/24. Pt is G1/P0. I reviewed her allergies, medications, Medical/Surgical/OB history, and appropriate screenings. There are no cats in the home . Based on history, this is a/an pregnancy uncomplicated . Her obstetrical history is significant for  N/A .  Patient Active Problem List   Diagnosis Date Noted   Supervision of other normal pregnancy, antepartum 03/06/2024   Otalgia of both ears 09/19/2023   Dyslipidemia 09/05/2022   Gastroenteritis 07/24/2022   Anxiety 07/04/2021   Depression 07/04/2021   Bipolar disorder (HCC) 07/04/2021    Concerns addressed today: None  Delivery Plans:  Plans to deliver at [facility].  Anatomy US  Explained Anatomy US  will be done at 20 weeks of gestational age.  Labs Discussed genetic screening with patient. Patient desires genetic testing to be drawn at new OB visit. Discussed possible labs to be drawn at new OB appointment.  COVID Vaccine Patient has had 2 COVID vaccines.   Social Determinants of Health Food Insecurity: denies food insecurity Transportation: Patient denies transportation needs.  First visit review I reviewed new OB appt with pt. I explained she will have blood work and pap smear/pelvic exam if indicated. Explained pt will be seen by Quince Bryant, CNM at first visit; encounter routed to appropriate provider.   Higinio Love, [facility] 03/06/2024  3:59 PM

## 2024-03-06 ENCOUNTER — Ambulatory Visit (INDEPENDENT_AMBULATORY_CARE_PROVIDER_SITE_OTHER)

## 2024-03-06 VITALS — BP 99/57 | HR 78 | Wt 171.0 lb

## 2024-03-06 DIAGNOSIS — Z3689 Encounter for other specified antenatal screening: Secondary | ICD-10-CM

## 2024-03-06 DIAGNOSIS — Z3480 Encounter for supervision of other normal pregnancy, unspecified trimester: Secondary | ICD-10-CM | POA: Insufficient documentation

## 2024-03-06 DIAGNOSIS — Z3403 Encounter for supervision of normal first pregnancy, third trimester: Secondary | ICD-10-CM | POA: Insufficient documentation

## 2024-03-06 NOTE — Patient Instructions (Signed)
 Commonly Asked Questions During Pregnancy  Cats: A parasite can be excreted in cat feces.  To avoid exposure you need to have another person empty the little box.  If you must empty the litter box you will need to wear gloves.  Wash your hands after handling your cat.  This parasite can also be found in raw or undercooked meat so this should also be avoided.  Colds, Sore Throats, Flu: Please check your medication sheet to see what you can take for symptoms.  If your symptoms are unrelieved by these medications please call the office.  Dental Work: Most any dental work Agricultural consultant recommends is permitted.  X-rays should only be taken during the first trimester if absolutely necessary.  Your abdomen should be shielded with a lead apron during all x-rays.  Please notify your provider prior to receiving any x-rays.  Novocaine is fine; gas is not recommended.  If your dentist requires a note from [place] prior to dental work please call the office and we will provide one for you.  Exercise: Exercise is an important part of staying healthy during your pregnancy.  You may continue most exercises you were accustomed to prior to pregnancy.  Later in your pregnancy you will most likely notice you have difficulty with activities requiring balance like riding a bicycle.  It is important that you listen to your body and avoid activities that put you at a higher risk of falling.  Adequate rest and staying well hydrated are a must!  If you have questions about the safety of specific activities ask your provider.    Exposure to Children with illness: Try to avoid obvious exposure; report any symptoms to [place] when noted,  If you have chicken pos, red measles or mumps, you should be immune to these diseases.   Please do not take any vaccines while pregnant unless you have checked with your OB provider.  Fetal Movement: After 28 weeks we recommend you do "kick counts" twice daily.  Lie or sit down in a calm quiet environment and  count your baby movements "kicks".  You should feel your baby at least 10 times per hour.  If you have not felt 10 kicks within the first hour get up, walk around and have something sweet to eat or drink then repeat for an additional hour.  If count remains less than 10 per hour notify your provider.  Fumigating: Follow your pest control agent's advice as to how long to stay out of your home.  Ventilate the area well before re-entering.  Hemorrhoids:   Most over-the-counter preparations can be used during pregnancy.  Check your medication to see what is safe to use.  It is important to use a stool softener or fiber in your diet and to drink lots of liquids.  If hemorrhoids seem to be getting worse please call the office.   Hot Tubs:  Hot tubs Jacuzzis and saunas are not recommended while pregnant.  These increase your internal body temperature and should be avoided.  Intercourse:  Sexual intercourse is safe during pregnancy as long as you are comfortable, unless otherwise advised by your provider.  Spotting may occur after intercourse; report any bright red bleeding that is heavier than spotting.  Labor:  If you know that you are in labor, please go to the hospital.  If you are unsure, please call the office and let us help you decide what to do.  Lifting, straining, etc:  If your job requires heavy  lifting or straining please check with your provider for any limitations.  Generally, you should not lift items heavier than that you can lift simply with your hands and arms (no back muscles)  Painting:  Paint fumes do not harm your pregnancy, but may make you ill and should be avoided if possible.  Latex or water based paints have less odor than oils.  Use adequate ventilation while painting.  Permanents & Hair Color:  Chemicals in hair dyes are not recommended as they cause increase hair dryness which can increase hair loss during pregnancy.  " Highlighting" and permanents are allowed.  Dye may be  absorbed differently and permanents may not hold as well during pregnancy.  Sunbathing:  Use a sunscreen, as skin burns easily during pregnancy.  Drink plenty of fluids; avoid over heating.  Tanning Beds:  Because their possible side effects are still unknown, tanning beds are not recommended.  Ultrasound Scans:  Routine ultrasounds are performed at approximately 20 weeks.  You will be able to see your baby's general anatomy an if you would like to know the gender this can usually be determined as well.  If it is questionable when you conceived you may also receive an ultrasound early in your pregnancy for dating purposes.  Otherwise ultrasound exams are not routinely performed unless there is a medical necessity.  Although you can request a scan we ask that you pay for it when conducted because insurance does not cover " patient request" scans.  Work: If your pregnancy proceeds without complications you may work until your due date, unless your physician or employer advises otherwise.  Round Ligament Pain/Pelvic Discomfort:  Sharp, shooting pains not associated with bleeding are fairly common, usually occurring in the second trimester of pregnancy.  They tend to be worse when standing up or when you remain standing for long periods of time.  These are the result of pressure of certain pelvic ligaments called "round ligaments".  Rest, Tylenol and heat seem to be the most effective relief.  As the womb and fetus grow, they rise out of the pelvis and the discomfort improves.  Please notify the office if your pain seems different than that described.  It may represent a more serious condition.  Common Medications Safe in Pregnancy  Acne:      Constipation:  Benzoyl Peroxide     Colace  Clindamycin      Dulcolax Suppository  Topica Erythromycin     Fibercon  Salicylic Acid      Metamucil         Miralax AVOID:        Senakot   Accutane    Cough:  Retin-A       Cough  Drops  Tetracycline      Phenergan w/ Codeine if Rx  Minocycline      Robitussin (Plain & DM)  Antibiotics:     Crabs/Lice:  Ceclor       RID  Cephalosporins    AVOID:  E-Mycins      Kwell  Keflex  Macrobid/Macrodantin   Diarrhea:  Penicillin      Kao-Pectate  Zithromax      Imodium AD         PUSH FLUIDS AVOID:       Cipro     Fever:  Tetracycline      Tylenol (Regular or Extra  Minocycline       Strength)  Levaquin      Extra Strength-Do not  Exceed 8 tabs/24 hrs Caffeine:        200mg /day (equiv. To 1 cup of coffee or  approx. 3 12 oz sodas)         Gas: Cold/Hayfever:       Gas-X  Benadryl      Mylicon  Claritin       Phazyme  **Claritin-D        Chlor-Trimeton    Headaches:  Dimetapp      ASA-Free Excedrin  Drixoral-Non-Drowsy     Cold Compress  Mucinex (Guaifenasin)     Tylenol (Regular or Extra  Sudafed/Sudafed-12 Hour     Strength)  **Sudafed PE Pseudoephedrine   Tylenol Cold & Sinus     Vicks Vapor Rub  Zyrtec  **AVOID if Problems With Blood Pressure         Heartburn: Avoid lying down for at least 1 hour after meals  Aciphex      Maalox     Rash:  Milk of Magnesia     Benadryl    Mylanta       1% Hydrocortisone Cream  Pepcid  Pepcid Complete   Sleep Aids:  Prevacid      Ambien   Prilosec       Benadryl  Rolaids       Chamomile Tea  Tums (Limit 4/day)     Unisom         Tylenol PM         Warm milk-add vanilla or  Hemorrhoids:       Sugar for taste  Anusol/Anusol H.C.  (RX: Analapram 2.5%)  Sugar Substitutes:  Hydrocortisone OTC     Ok in moderation  Preparation H      Tucks        Vaseline lotion applied to tissue with wiping    Herpes:     Throat:  Acyclovir      Oragel  Famvir  Valtrex     Vaccines:         Flu Shot Leg Cramps:       *Gardasil  Benadryl      Hepatitis A         Hepatitis B Nasal Spray:       Pneumovax  Saline Nasal Spray     Polio Booster         Tetanus Nausea:       Tuberculosis test or PPD  Vitamin  B6 25 mg TID   AVOID:    Dramamine      *Gardasil  Emetrol       Live Poliovirus  Ginger Root 250 mg QID    MMR (measles, mumps &  High Complex Carbs @ Bedtime    rebella)  Sea Bands-Accupressure    Varicella (Chickenpox)  Unisom 1/2 tab TID     *No known complications           If received before Pain:         Known pregnancy;   Darvocet       Resume series after  Lortab        Delivery  Percocet    Yeast:   Tramadol      Femstat  Tylenol 3      Gyne-lotrimin  Ultram       Monistat  Vicodin           MISC:         All Sunscreens  Hair Coloring/highlights          Insect Repellant's          (Including DEET)         Mystic Tans First Trimester of Pregnancy  The first trimester of pregnancy starts on the first day of your last monthly period until the end of week 13. This is months 1 through 3 of pregnancy. A week after a sperm fertilizes an egg, the egg will implant into the wall of the uterus and begin to develop into a baby. Body changes during your first trimester Your body goes through many changes during pregnancy. The changes usually return to normal after your baby is born. Physical changes Your breasts may grow larger and may hurt. The area around your nipples may get darker. Your periods will stop. Your hair and nails may grow faster. You may pee more often. Health changes You may tire easily. Your gums may bleed and may be sensitive when you brush and floss. You may not feel hungry. You may have heartburn. You may throw up or feel like you may throw up. You may want to eat some foods, but not others. You may have headaches. You may have trouble pooping (constipation). Other changes Your emotions may change from day to day. You may have more dreams. Follow these instructions at home: Medicines Talk to your health care provider if you're taking medicines. Ask if the medicines are safe to take during pregnancy. Your provider may change the medicines that  you take. Do not take any medicines unless told to by your provider. Take a prenatal vitamin that has at least 600 micrograms (mcg) of folic acid. Do not use herbal medicines, illegal substances, or medicines that are not approved by your provider. Eating and drinking While you're pregnant your body needs extra food for your growing baby. Talk with your provider about what to eat while pregnant. Activity Most women are able to exercise during pregnancy. Exercises may need to change as your pregnancy goes on. Talk to your provider about your activities and exercise routines. Relieving pain and discomfort Wear a good, supportive bra if your breasts hurt. Rest with your legs raised if you have leg cramps or low back pain. Safety Wear your seatbelt at all times when you're in a car. Talk to your provider if someone hits you, hurts you, or yells at you. Talk with your provider if you're feeling sad or have thoughts of hurting yourself. Lifestyle Certain things can be harmful while you're pregnant. Follow these rules: Do not use hot tubs, steam rooms, or saunas. Do not douche. Do not use tampons or scented pads. Do not drink alcohol,smoke, vape, or use products with nicotine or tobacco in them. If you need help quitting, talk with your provider. Avoid cat litter boxes and soil used by cats. These things carry germs that can cause harm to your pregnancy and your baby. General instructions Keep all follow-up visits. It helps you and your unborn baby stay as healthy as possible. Write down your questions. Take them to your visits. Your provider will: Talk with you about your overall health. Give you advice or refer you to specialists who can help with different needs, including: Prenatal education classes. Mental health and counseling. Foods and healthy eating. Ask for help if you need help with food. Call your dentist and ask to be seen. Brush your teeth with a soft toothbrush. Floss  gently. Where to find more information American Pregnancy Association: americanpregnancy.org  [employer]: acog.org Office on [employer] Health: [employer] Contact a health care provider if: You feel dizzy, faint, or have a fever. You vomit or have watery poop (diarrhea) for 2 days or more. You have abnormal discharge or bleeding from your vagina. You have pain when you pee or your pee smells bad. You have cramps, pain, or pressure in your belly area. Get help right away if: You have trouble breathing or chest pain. You have any kind of injury, such as from a fall or a car crash. These symptoms may be an emergency. Get help right away. Call 911. Do not wait to see if the symptoms will go away. Do not drive yourself to the hospital. This information is not intended to replace advice given to you by your health care provider. Make sure you discuss any questions you have with your health care provider. Document Revised: 07/25/2023 Document Reviewed: 02/22/2023 Elsevier Patient Education  2024 [employer].

## 2024-03-11 ENCOUNTER — Ambulatory Visit

## 2024-03-11 DIAGNOSIS — Z3A09 9 weeks gestation of pregnancy: Secondary | ICD-10-CM | POA: Diagnosis not present

## 2024-03-11 DIAGNOSIS — Z3401 Encounter for supervision of normal first pregnancy, first trimester: Secondary | ICD-10-CM | POA: Diagnosis not present

## 2024-03-16 ENCOUNTER — Ambulatory Visit
Admission: EM | Admit: 2024-03-16 | Discharge: 2024-03-16 | Disposition: A | Discharge status: Home / Self Care | Attending: Family Medicine | Admitting: Family Medicine

## 2024-03-16 DIAGNOSIS — H66002 Acute suppurative otitis media without spontaneous rupture of ear drum, left ear: Secondary | ICD-10-CM | POA: Diagnosis not present

## 2024-03-16 DIAGNOSIS — J069 Acute upper respiratory infection, unspecified: Secondary | ICD-10-CM | POA: Diagnosis not present

## 2024-03-16 DIAGNOSIS — R42 Dizziness and giddiness: Secondary | ICD-10-CM | POA: Insufficient documentation

## 2024-03-16 LAB — GROUP A STREP BY PCR: Group A Strep by PCR: NOT DETECTED

## 2024-03-16 MED ORDER — AMOXICILLIN-POT CLAVULANATE 875-125 MG PO TABS: 1.0000 | ORAL_TABLET | Freq: Two times a day (BID) | ORAL | 0 refills | Status: DC

## 2024-03-16 NOTE — ED Triage Notes (Signed)
 Patient states that sore throat started last night and woke up with it worse. Dizziness that started this morning.

## 2024-03-16 NOTE — Discharge Instructions (Signed)
 Stop by the pharmacy to pick up your prescriptions.  Follow up with your primary care provider or return to the urgent care, if not improving.

## 2024-03-16 NOTE — ED Provider Notes (Signed)
 [facility]    CSN: [id number] Arrival date & time: 03/16/24  1240      History   Chief Complaint Chief Complaint  Patient presents with   Dizziness   Sore Throat    HPI Christina Butler is a 18 y.o. female.   HPI  History obtained from the patient. Christina Butler presents for sore throat, body aches, nasal congestion, headache and dizziness that started last night.  Has been having nasal congestion for 2 weeks. No known sick contacts. Tried nothing for her symptoms. Denies vomiting, fever, or diarrhea.  The room starts spinning randomly. This has not happened before.   She is [redacted] weeks pregnant.  This is her first pregnancy.  She is taking her prenatal vitamins. She has been drinking a lot of water.  There has been no vaginal bleeding.         Past Medical History:  Diagnosis Date   Anxiety    Depression     Patient Active Problem List   Diagnosis Date Noted   Supervision of other normal pregnancy, antepartum 03/06/2024   Otalgia of both ears 09/19/2023   Dyslipidemia 09/05/2022   Gastroenteritis 07/24/2022   Anxiety 07/04/2021   Depression 07/04/2021   Bipolar disorder (HCC) 07/04/2021    Past Surgical History:  Procedure Laterality Date   TONSILLECTOMY      OB History     Gravida  1   Para      Term      Preterm      AB      Living         SAB      IAB      Ectopic      Multiple      Live Births               Home Medications    Prior to Admission medications   Medication Sig Start Date End Date Taking? Authorizing Provider  amoxicillin -clavulanate (AUGMENTIN ) 875-125 MG tablet Take 1 tablet by mouth every 12 (twelve) hours. 03/16/24  Yes Delorise Hunkele, DO  Prenatal Vit-Fe Fumarate-FA (PRENATAL PO) Take by mouth.   Yes [provider]  metoCLOPramide (REGLAN) 10 MG tablet Take by mouth. 02/10/24   [provider]    Family History Family History  Problem Relation Age of Onset   Diabetes Mother     Healthy Father     Social History Social History   Tobacco Use   Smoking status: Never    Passive exposure: Yes   Smokeless tobacco: Never   Tobacco comments:    father smokes outside and in the car with the windows down  Vaping Use   Vaping status: Every Day   Substances: Nicotine  Substance Use Topics   Alcohol use: No   Drug use: Never     Allergies   Iodine and Iodinated contrast media   Review of Systems Review of Systems: negative unless otherwise stated in HPI.      Physical Exam Triage Vital Signs ED Triage Vitals  Encounter Vitals Group     BP 03/16/24 1256 108/78     Systolic BP Percentile --      Diastolic BP Percentile --      Pulse Rate 03/16/24 1256 95     Resp 03/16/24 1256 14     Temp 03/16/24 1256 99 F (37.2 C)     Temp Source 03/16/24 1256 Oral     SpO2 03/16/24 1256 98 %  Weight --      Height --      Head Circumference --      Peak Flow --      Pain Score 03/16/24 1255 2     Pain Loc --      Pain Education --      Exclude from Growth Chart --    No data found.  Updated Vital Signs BP 108/78 (BP Location: Left Arm)   Pulse 95   Temp 99 F (37.2 C) (Oral)   Resp 14   LMP 01/05/2024 (Exact Date)   SpO2 98%   Visual Acuity Right Eye Distance:   Left Eye Distance:   Bilateral Distance:    Right Eye Near:   Left Eye Near:    Bilateral Near:     Physical Exam GEN:     alert, non-toxic appearing female in no distress    HENT:  mucus membranes moist, oropharyngeal without lesions, mild erythema, no tonsillar hypertrophy or exudates, no nasal discharge, right  TM normal, left TM erythema with effusion  EYES:   pupils equal and reactive, EOM intact, no scleral injection or discharge, no Nystagmus NECK:  normal ROM, no lymphadenopathy, no meningismus   RESP:  no increased work of breathing, clear to auscultation bilaterally CVS:   regular rate and rhythm Skin:   warm and dry, no rash on visible skin    UC Treatments /  Results  Labs (all labs ordered are listed, but only abnormal results are displayed) Labs Reviewed  GROUP A STREP BY PCR    EKG   Radiology No results found.  Procedures Procedures (including critical care time)  Medications Ordered in UC Medications - No data to display  Initial Impression / Assessment and Plan / UC Course  I have reviewed the triage vital signs and the nursing notes.  Pertinent labs & imaging results that were available during my care of the patient were reviewed by me and considered in my medical decision making (see chart for details).       Pt is a 18 y.o. female who presents for 1 day of respiratory symptoms. Christina Butler is afebrile here . Satting well on room air. Overall pt is non-toxic appearing, well hydrated, without respiratory distress. Pulmonary exam is unremarkable.  Strep pcr obtained and was negative. She has evidence of left otitis media. The drainage from her ear may be irritating her throat. Treat with antibiotics as below. Discussed symptomatic treatment.  Typical duration of symptoms discussed.   Return and ED precautions given and voiced understanding. Discussed MDM, treatment plan and plan for follow-up with patient who agrees with plan.     Final Clinical Impressions(s) / UC Diagnoses   Final diagnoses:  Non-recurrent acute suppurative otitis media of left ear without spontaneous rupture of tympanic membrane  Dizziness  URI with cough and congestion     Discharge Instructions      Stop by the pharmacy to pick up your prescriptions.  Follow up with your primary care provider or return to the urgent care, if not improving.      ED Prescriptions     Medication Sig Dispense Auth. Provider   amoxicillin -clavulanate (AUGMENTIN ) 875-125 MG tablet Take 1 tablet by mouth every 12 (twelve) hours. 14 tablet Lonzo Saulter, DO      PDMP not reviewed this encounter.   Daenerys Buttram, DO 03/18/24 [phone]

## 2024-03-18 DIAGNOSIS — B3731 Acute candidiasis of vulva and vagina: Secondary | ICD-10-CM | POA: Diagnosis not present

## 2024-03-18 DIAGNOSIS — Z91041 Radiographic dye allergy status: Secondary | ICD-10-CM | POA: Diagnosis not present

## 2024-03-18 DIAGNOSIS — O23591 Infection of other part of genital tract in pregnancy, first trimester: Secondary | ICD-10-CM | POA: Diagnosis not present

## 2024-03-18 DIAGNOSIS — N898 Other specified noninflammatory disorders of vagina: Secondary | ICD-10-CM | POA: Diagnosis not present

## 2024-03-18 DIAGNOSIS — N9489 Other specified conditions associated with female genital organs and menstrual cycle: Secondary | ICD-10-CM | POA: Diagnosis not present

## 2024-03-18 DIAGNOSIS — O99891 Other specified diseases and conditions complicating pregnancy: Secondary | ICD-10-CM | POA: Diagnosis not present

## 2024-03-18 DIAGNOSIS — Z9109 Other allergy status, other than to drugs and biological substances: Secondary | ICD-10-CM | POA: Diagnosis not present

## 2024-03-18 DIAGNOSIS — Z3A10 10 weeks gestation of pregnancy: Secondary | ICD-10-CM | POA: Diagnosis not present

## 2024-03-20 ENCOUNTER — Telehealth: Payer: Self-pay

## 2024-03-20 NOTE — Telephone Encounter (Signed)
 Received call on triage line from patient stating she is [redacted] weeks pregnant and very nauseous.  She says she's hungry but can't keep food down.  She has tried gingerale, ginger chews and saltines.  She wonders if there is a medication we can prescribe.  Counseled on small frequent meals, trying peppermint tea or candies, avoiding greasy or spicy foods, and eating saltines before getting out of bed. Advised Pepcid and Zantac are both safe in pregnancy. Gave patient instructions to take Vitamin B6 25mg  three times a day and a Unisom 10mg  before bed.  She said she will try these interventions and will call back if no improvement in symptoms.   Liane Redman RN

## 2024-03-31 ENCOUNTER — Other Ambulatory Visit (HOSPITAL_COMMUNITY)
Admission: RE | Admit: 2024-03-31 | Discharge: 2024-03-31 | Disposition: A | Discharge status: Home / Self Care | Source: Ambulatory Visit | Attending: Certified Nurse Midwife | Admitting: Certified Nurse Midwife

## 2024-03-31 ENCOUNTER — Ambulatory Visit (INDEPENDENT_AMBULATORY_CARE_PROVIDER_SITE_OTHER): Admitting: Certified Nurse Midwife

## 2024-03-31 ENCOUNTER — Encounter: Payer: Self-pay | Admitting: Certified Nurse Midwife

## 2024-03-31 VITALS — BP 122/85 | HR 80 | Wt 168.0 lb

## 2024-03-31 DIAGNOSIS — D649 Anemia, unspecified: Secondary | ICD-10-CM | POA: Diagnosis not present

## 2024-03-31 DIAGNOSIS — Z13220 Encounter for screening for lipoid disorders: Secondary | ICD-10-CM

## 2024-03-31 DIAGNOSIS — Z1379 Encounter for other screening for genetic and chromosomal anomalies: Secondary | ICD-10-CM | POA: Diagnosis not present

## 2024-03-31 DIAGNOSIS — Z0283 Encounter for blood-alcohol and blood-drug test: Secondary | ICD-10-CM

## 2024-03-31 DIAGNOSIS — Z113 Encounter for screening for infections with a predominantly sexual mode of transmission: Secondary | ICD-10-CM

## 2024-03-31 DIAGNOSIS — Z3A12 12 weeks gestation of pregnancy: Secondary | ICD-10-CM

## 2024-03-31 DIAGNOSIS — O99011 Anemia complicating pregnancy, first trimester: Secondary | ICD-10-CM

## 2024-03-31 DIAGNOSIS — Z131 Encounter for screening for diabetes mellitus: Secondary | ICD-10-CM

## 2024-03-31 DIAGNOSIS — Z0184 Encounter for antibody response examination: Secondary | ICD-10-CM

## 2024-03-31 NOTE — Patient Instructions (Signed)
 First Trimester of Pregnancy  The first trimester of pregnancy starts on the first day of your last monthly period until the end of week 13. This is months 1 through 3 of pregnancy. A week after a sperm fertilizes an egg, the egg will implant into the wall of the uterus and begin to develop into a baby. Body changes during your first trimester Your body goes through many changes during pregnancy. The changes usually return to normal after your baby is born. Physical changes Your breasts may grow larger and may hurt. The area around your nipples may get darker. Your periods will stop. Your hair and nails may grow faster. You may pee more often. Health changes You may tire easily. Your gums may bleed and may be sensitive when you brush and floss. You may not feel hungry. You may have heartburn. You may throw up or feel like you may throw up. You may want to eat some foods, but not others. You may have headaches. You may have trouble pooping (constipation). Other changes Your emotions may change from day to day. You may have more dreams. Follow these instructions at home: Medicines Talk to your health care provider if you're taking medicines. Ask if the medicines are safe to take during pregnancy. Your provider may change the medicines that you take. Do not take any medicines unless told to by your provider. Take a prenatal vitamin that has at least 600 micrograms (mcg) of folic acid. Do not use herbal medicines, illegal substances, or medicines that are not approved by your provider. Eating and drinking While you're pregnant your body needs extra food for your growing baby. Talk with your provider about what to eat while pregnant. Activity Most women are able to exercise during pregnancy. Exercises may need to change as your pregnancy goes on. Talk to your provider about your activities and exercise routines. Relieving pain and discomfort Wear a good, supportive bra if your breasts  hurt. Rest with your legs raised if you have leg cramps or low back pain. Safety Wear your seatbelt at all times when you're in a car. Talk to your provider if someone hits you, hurts you, or yells at you. Talk with your provider if you're feeling sad or have thoughts of hurting yourself. Lifestyle Certain things can be harmful while you're pregnant. Follow these rules: Do not use hot tubs, steam rooms, or saunas. Do not douche. Do not use tampons or scented pads. Do not drink alcohol,smoke, vape, or use products with nicotine or tobacco in them. If you need help quitting, talk with your provider. Avoid cat litter boxes and soil used by cats. These things carry germs that can cause harm to your pregnancy and your baby. General instructions Keep all follow-up visits. It helps you and your unborn baby stay as healthy as possible. Write down your questions. Take them to your visits. Your provider will: Talk with you about your overall health. Give you advice or refer you to specialists who can help with different needs, including: Prenatal education classes. Mental health and counseling. Foods and healthy eating. Ask for help if you need help with food. Call your dentist and ask to be seen. Brush your teeth with a soft toothbrush. Floss gently. Where to find more information American Pregnancy Association: americanpregnancy.org [employer] of Obstetricians and Gynecologists: acog.org Office on [employer] Health: TravelLess[employer] Contact a health care provider if: You feel dizzy, faint, or have a fever. You vomit or have watery poop (diarrhea) for 2  days or more. You have abnormal discharge or bleeding from your vagina. You have pain when you pee or your pee smells bad. You have cramps, pain, or pressure in your belly area. Get help right away if: You have trouble breathing or chest pain. You have any kind of injury, such as from a fall or a car crash. These symptoms may be an  emergency. Get help right away. Call 911. Do not wait to see if the symptoms will go away. Do not drive yourself to the hospital. This information is not intended to replace advice given to you by your health care provider. Make sure you discuss any questions you have with your health care provider. Document Revised: 07/25/2023 Document Reviewed: 02/22/2023 Elsevier Patient Education  2024 Else[employer]. Second Trimester of Pregnancy  The second trimester of pregnancy is from week 14 through week 27. This is months 4 through 6 of pregnancy. During the second trimester: Morning sickness is less or has stopped. You may have more energy. You may feel hungry more often. At this time, your unborn baby is growing very fast. At the end of the sixth month, the unborn baby may be up to 12 inches long and weigh about 1 pounds. You will likely start to feel the baby move between 16 and 20 weeks of pregnancy. Body changes during your second trimester Your body continues to change during this time. The changes usually go away after your baby is born. Physical changes You will gain more weight. Your belly will get bigger. You may begin to get stretch marks on your hips, belly, and breasts. Your breasts will keep growing and may hurt. You may get dark spots or blotches on your face. A dark line from your belly button to the pubic area may appear. This line is called linea nigra. Your hair may grow faster and get thicker. Health changes You may have headaches. You may have heartburn. You may pee more often. You may have swollen, bulging veins (varicose veins). You may have trouble pooping (constipation), or swollen veins in the butt that can itch or get painful (hemorrhoids). You may have back pain. This is caused by: Weight gain. Pregnancy hormones that are relaxing the joints in your pelvis. Follow these instructions at home: Medicines Talk to your health care provider if you're taking  medicines. Ask if the medicines are safe to take during pregnancy. Your provider may change the medicines that you take. Do not take any medicines unless told to by your provider. Take a prenatal vitamin that has at least 600 micrograms (mcg) of folic acid. Do not use herbal medicines, illegal drugs, or medicines that are not approved by your provider. Eating and drinking While you're pregnant your body needs extra food for your growing baby. Talk with your provider about what to eat while pregnant. Activity Most women are able to exercise during pregnancy. Exercises may need to change as your pregnancy goes on. Talk to your provider about your activities and exercise routines. Relieving pain and discomfort Wear a good, supportive bra if your breasts hurt. Rest with your legs raised if you have leg cramps or low back pain. Take warm sitz baths to soothe pain from hemorrhoids. Use hemorrhoid cream if your provider says it's okay. Do not douche. Do not use tampons or scented pads. Do not use hot tubs, steam rooms, or saunas. Safety Wear your seatbelt at all times when you're in a car. Talk to your provider if someone hits you,  hurts you, or yells at you. Talk with your provider if you're feeling sad or have thoughts of hurting yourself. Lifestyle Certain things can be harmful while you're pregnant. It's best to avoid the following: Do not drink alcohol,smoke, vape, or use products with nicotine or tobacco in them. If you need help quitting, talk with your provider. Avoid cat litter boxes and soil used by cats. These things carry germs that can cause harm to your pregnancy and your baby. General instructions Keep all follow-up visits. It helps you and your unborn baby stay as healthy as possible. Write down your questions. Take them to your prenatal visits. Your provider will: Talk with you about your overall health. Give you advice or refer you to specialists who can help with different  needs, including: Prenatal education classes. Mental health and counseling. Foods and healthy eating. Ask for help if you need help with food. Where to find more information American Pregnancy Association: americanpregnancy.org [employer] of [employer]stetricians and Gynecologists: acog.org Office on [employer] Health: [employer] Contact a health care provider if: You have a headache that does not go away when you take medicine. You have any of these problems: You can't eat or drink. You throw up or feel like you may throw up. You have watery poop (diarrhea) for 2 days or more. You have pain when you pee or your pee smells bad. You have been sick for 2 days or more and are not getting better. Contact your provider right away if: You have any of these coming from your vagina: Abnormal discharge. Bad-smelling fluid. Bleeding. Your baby is moving less than usual. You have contractions, belly cramping, or have pain in your pelvis or lower back. You have symptoms of high blood pressure or preeclampsia. These include: A severe, throbbing headache that does not go away. Sudden or extreme swelling of your face, hands, legs, or feet. Vision problems: You see spots. You have blurry vision. Your eyes are sensitive to light. If you can't reach the provider, go to an urgent care or emergency room. Get help right away if: You faint, become confused, or can't think clearly. You have chest pain or trouble breathing. You have any kind of injury, such as from a fall or a car crash. These symptoms may be an emergency. Call 911 right away. Do not wait to see if the symptoms will go away. Do not drive yourself to the hospital. This information is not intended to replace advice given to you by your health care provider. Make sure you discuss any questions you have with your health care provider. Document Revised: 07/25/2023 Document Reviewed: 02/22/2023 Elsevier Patient Education  2024 [employer].

## 2024-03-31 NOTE — Progress Notes (Unsigned)
 NEW OB HISTORY AND PHYSICAL  SUBJECTIVE:       Christina Butler is a 18 y.o. G1P0 female, Patient's last menstrual period was 01/05/2024 (exact date)., Estimated Date of Delivery: 10/11/24, [redacted]w[redacted]d, presents today for establishment of Prenatal Care. She reports {:313260}   Social history Partner/Relationship: Living situation: Work: Exercise: Substance use:  Indications for ASA therapy (per uptodate) One of the following: Previous pregnancy with preeclampsia, especially early onset and with an adverse outcome {yes/no:20286} Multifetal gestation {yes/no:20286} Chronic hypertension {yes/no:20286} Type 1 or 2 diabetes mellitus {yes/no:20286} Chronic kidney disease {yes/no:20286} Autoimmune disease (antiphospholipid syndrome, systemic lupus erythematosus) {yes/no:20286}  Two or more of the following: Nulliparity {yes/no:20286} Obesity (body mass index >30 kg/m2) {yes/no:20286} Family history of preeclampsia in mother or sister {yes/no:20286} Age >=35 years {yes/no:20286} Sociodemographic characteristics (African American race, low socioeconomic level) {yes/no:20286} Personal risk factors (eg, previous pregnancy with low birth weight or small for gestational age infant, previous adverse pregnancy outcome [eg, stillbirth], interval >10 years between pregnancies) {yes/no:20286}   Gynecologic History Patient's last menstrual period was 01/05/2024 (exact date). {Blank multiple:19196::"Normal","Abnormal","Unknown"} Contraception: {method:5051} Last Pap: ***. Results were: {norm/abn:16337}  Obstetric History OB History  Gravida Para Term Preterm AB Living  1       SAB IAB Ectopic Multiple Live Births          # Outcome Date GA Lbr Len/2nd Weight Sex Type Anes PTL Lv  1 Current             Past Medical History:  Diagnosis Date   Anxiety    Depression     Past Surgical History:  Procedure Laterality Date   TONSILLECTOMY      Current Outpatient Medications on File Prior to  Visit  Medication Sig Dispense Refill   amoxicillin -clavulanate (AUGMENTIN ) 875-125 MG tablet Take 1 tablet by mouth every 12 (twelve) hours. 14 tablet 0   metoCLOPramide (REGLAN) 10 MG tablet Take by mouth.     Prenatal Vit-Fe Fumarate-FA (PRENATAL PO) Take by mouth.     No current facility-administered medications on file prior to visit.    Allergies  Allergen Reactions   Iodine Diarrhea and Nausea Only   Iodinated Contrast Media Nausea And Vomiting    Social History   Socioeconomic History   Marital status: Single    Spouse name: Not on file   Number of children: 0   Years of education: Not on file   Highest education level: Some college, no degree  Occupational History   Occupation: Biomedical scientist, Conservation officer, nature  Tobacco Use   Smoking status: Never    Passive exposure: Yes   Smokeless tobacco: Never   Tobacco comments:    father smokes outside and in the car with the windows down  Vaping Use   Vaping status: Every Day   Substances: Nicotine  Substance and Sexual Activity   Alcohol use: No   Drug use: Never   Sexual activity: Yes    Partners: Male    Birth control/protection: None  Other Topics Concern   Not on file  Social History Narrative   Not on file   Social Drivers of Health   Financial Resource Strain: Low Risk  (03/06/2024)   Overall Financial Resource Strain (CARDIA)    Difficulty of Paying Living Expenses: Not hard at all  Food Insecurity: No Food Insecurity (03/06/2024)   Hunger Vital Sign    Worried About Running Out of Food in the Last Year: Never true    Ran Out of Food in the  Last Year: Never true  Transportation Needs: No Transportation Needs (03/06/2024)   PRAPARE - Administrator, Civil Service (Medical): No    Lack of Transportation (Non-Medical): No  Physical Activity: Sufficiently Active (03/06/2024)   Exercise Vital Sign    Days of Exercise per Week: 4 days    Minutes of Exercise per Session: 60 min  Stress: No Stress Concern Present  (03/06/2024)   [employer] of Occupational Health - Occupational Stress Questionnaire    Feeling of Stress : Only a little  Social Connections: Moderately Isolated (03/06/2024)   Social Connection and Isolation Panel [NHANES]    Frequency of Communication with Friends and Family: Three times a week    Frequency of Social Gatherings with Friends and Family: More than three times a week    Attends Religious Services: Never    Database administrator or Organizations: No    Attends Engineer, structural: Not on file    Marital Status: Living with partner  Intimate Partner Violence: Not At Risk (03/18/2024)   Received from [facility]   Humiliation, Afraid, Rape, and Kick questionnaire    Fear of Current or Ex-Partner: No    Emotionally Abused: No    Physically Abused: No    Sexually Abused: No    Family History  Problem Relation Age of Onset   Diabetes Mother    Healthy Father     The following portions of the patient's history were reviewed and updated as appropriate: allergies, current medications, past OB history, past medical history, past surgical history, past family history, past social history, and problem list.  Constitutional: Denied constitutional symptoms, night sweats, recent illness, fatigue, fever, insomnia and weight loss.  Eyes: Denied eye symptoms, eye pain, photophobia, vision change and visual disturbance.  Ears/Nose/Throat/Neck: Denied ear, nose, throat or neck symptoms, hearing loss, nasal discharge, sinus congestion and sore throat.  Cardiovascular: Denied cardiovascular symptoms, arrhythmia, chest pain/pressure, edema, exercise intolerance, orthopnea and palpitations.  Respiratory: Denied pulmonary symptoms, asthma, pleuritic pain, productive sputum, cough, dyspnea and wheezing.  Gastrointestinal: Denied gastro-esophageal reflux, melena, nausea and vomiting.  Genitourinary:*** Denied genitourinary symptoms including symptomatic vaginal discharge,  pelvic relaxation issues, and urinary complaints.  Musculoskeletal: Denied musculoskeletal symptoms, stiffness, swelling, muscle weakness and myalgia.  Dermatologic: Denied dermatology symptoms, rash and scar.  Neurologic: Denied neurology symptoms, dizziness, headache, neck pain and syncope.  Psychiatric: Denied psychiatric symptoms, anxiety and depression.  Endocrine: Denied endocrine symptoms including hot flashes and night sweats.     OBJECTIVE: Initial Physical Exam (New OB)  Physical Exam Vitals reviewed.  Constitutional:      General: She is not in acute distress.    Appearance: Normal appearance.  HENT:     Head: Normocephalic.  Neck:     Thyroid: No thyroid mass or thyromegaly.  Cardiovascular:     Rate and Rhythm: Normal rate and regular rhythm.     Heart sounds: Normal heart sounds.  Pulmonary:     Effort: Pulmonary effort is normal.     Breath sounds: Normal breath sounds.  Chest:  Breasts:    Tanner Score is 5.     Right: No inverted nipple, mass or tenderness.     Left: No inverted nipple, mass or tenderness.  Abdominal:     Palpations: Abdomen is soft.     Tenderness: There is no abdominal tenderness.  Musculoskeletal:     Cervical back: Neck supple. No tenderness.  Skin:    General: Skin is  warm and dry.  Neurological:     Mental Status: She is alert and oriented to person, place, and time.  Psychiatric:        Mood and Affect: Mood and affect normal.        Behavior: Behavior is cooperative.      ASSESSMENT: {pregnancy state assessment:313271::"Normal pregnancy"}   PLAN: Routine prenatal care. We discussed an overview of prenatal care and when to call. Reviewed diet, exercise, and weight gain recommendations in pregnancy. Discussed benefits of breastfeeding and lactation resources at [facility]. I reviewed labs and answered all questions.  1. Anemia, unspecified type (Primary) - NOB Panel - Culture, OB Urine - Monitor Drug Profile 14(MW) -  Nicotine  screen, urine - Urinalysis, Routine w reflex microscopic - MaterniT 21 plus Core, Blood - Hemoglobinopathy evaluation  2. Encounter for drug screening - NOB Panel - Culture, OB Urine - Monitor Drug Profile 14(MW) - Nicotine  screen, urine - Urinalysis, Routine w reflex microscopic - MaterniT 21 plus Core, Blood - Hemoglobinopathy evaluation  3. Encounter for screening for diabetes mellitus - NOB Panel - Culture, OB Urine - Monitor Drug Profile 14(MW) - Nicotine  screen, urine - Urinalysis, Routine w reflex microscopic - MaterniT 21 plus Core, Blood - Hemoglobinopathy evaluation  4. Genetic screening - NOB Panel - Culture, OB Urine - Monitor Drug Profile 14(MW) - Nicotine  screen, urine - Urinalysis, Routine w reflex microscopic - MaterniT 21 plus Core, Blood - Hemoglobinopathy evaluation  5. Immunity status testing - NOB Panel - Culture, OB Urine - Monitor Drug Profile 14(MW) - Nicotine  screen, urine - Urinalysis, Routine w reflex microscopic - MaterniT 21 plus Core, Blood - Hemoglobinopathy evaluation  6. Lipid screening - NOB Panel - Culture, OB Urine - Monitor Drug Profile 14(MW) - Nicotine  screen, urine - Urinalysis, Routine w reflex microscopic - MaterniT 21 plus Core, Blood - Hemoglobinopathy evaluation  7. Screening for STDs (sexually transmitted diseases) - NOB Panel - Culture, OB Urine - Monitor Drug Profile 14(MW) - Nicotine  screen, urine - Urinalysis, Routine w reflex microscopic - MaterniT 21 plus Core, Blood - Hemoglobinopathy evaluation - Cervicovaginal ancillary only  8. [redacted] weeks gestation of pregnancy - NOB Panel - Culture, OB Urine - Monitor Drug Profile 14(MW) - Nicotine  screen, urine - Urinalysis, Routine w reflex microscopic - MaterniT 21 plus Core, Blood - Hemoglobinopathy evaluation   Forestine Igo, CNM

## 2024-04-01 LAB — URINALYSIS, ROUTINE W REFLEX MICROSCOPIC
Bilirubin, UA: NEGATIVE
Glucose, UA: NEGATIVE
Ketones, UA: NEGATIVE
Leukocytes,UA: NEGATIVE
Nitrite, UA: NEGATIVE
Protein,UA: NEGATIVE
RBC, UA: NEGATIVE
Specific Gravity, UA: 1.017 (ref 1.005–1.030)
Urobilinogen, Ur: 1 mg/dL (ref 0.2–1.0)
pH, UA: 7.5 (ref 5.0–7.5)

## 2024-04-01 MED ORDER — DOCUSATE SODIUM 100 MG PO CAPS
100.0000 mg | ORAL_CAPSULE | Freq: Two times a day (BID) | ORAL | 2 refills | Status: DC | PRN
Start: 2024-04-01 — End: 2024-08-18

## 2024-04-01 MED ORDER — ONDANSETRON 4 MG PO TBDP: 4.0000 mg | ORAL_TABLET | Freq: Three times a day (TID) | ORAL | 1 refills | Status: DC | PRN

## 2024-04-01 MED ORDER — METOCLOPRAMIDE HCL 10 MG PO TABS: 10.0000 mg | ORAL_TABLET | Freq: Three times a day (TID) | ORAL | 1 refills | Status: DC | PRN

## 2024-04-01 MED ORDER — FAMOTIDINE 20 MG PO TABS: 20.0000 mg | ORAL_TABLET | Freq: Two times a day (BID) | ORAL | 3 refills | Status: DC

## 2024-04-02 LAB — CERVICOVAGINAL ANCILLARY ONLY
Chlamydia: NEGATIVE
Comment: NEGATIVE
Comment: NORMAL
Neisseria Gonorrhea: NEGATIVE

## 2024-04-02 LAB — HGB FRACTIONATION CASCADE
Hgb A2: 2.4 % (ref 1.8–3.2)
Hgb A: 97.6 % (ref 96.4–98.8)
Hgb F: 0 % (ref 0.0–2.0)
Hgb S: 0 %

## 2024-04-02 LAB — MONITOR DRUG PROFILE 14(MW)
Amphetamine Scrn, Ur: NEGATIVE ng/mL
BARBITURATE SCREEN URINE: NEGATIVE ng/mL
BENZODIAZEPINE SCREEN, URINE: NEGATIVE ng/mL
Buprenorphine, Urine: NEGATIVE ng/mL
CANNABINOIDS UR QL SCN: NEGATIVE ng/mL
Cocaine (Metab) Scrn, Ur: NEGATIVE ng/mL
Creatinine(Crt), U: 109.4 mg/dL (ref 20.0–300.0)
Fentanyl, Urine: NEGATIVE pg/mL
Meperidine Screen, Urine: NEGATIVE ng/mL
Methadone Screen, Urine: NEGATIVE ng/mL
OXYCODONE+OXYMORPHONE UR QL SCN: NEGATIVE ng/mL
Opiate Scrn, Ur: NEGATIVE ng/mL
Ph of Urine: 6.9 (ref 4.5–8.9)
Phencyclidine Qn, Ur: NEGATIVE ng/mL
Propoxyphene Scrn, Ur: NEGATIVE ng/mL
SPECIFIC GRAVITY: 1.016
Tramadol Screen, Urine: NEGATIVE ng/mL

## 2024-04-02 LAB — CBC/D/PLT+RPR+RH+ABO+RUBIGG...
Basophils Absolute: 0 10*3/uL (ref 0.0–0.2)
Basos: 0 %
EOS (ABSOLUTE): 0 10*3/uL (ref 0.0–0.4)
Eos: 1 %
HCV Ab: NONREACTIVE
HIV Screen 4th Generation wRfx: NONREACTIVE
Hematocrit: 35.9 % (ref 34.0–46.6)
Hemoglobin: 12.3 g/dL (ref 11.1–15.9)
Hepatitis B Surface Ag: NEGATIVE
Immature Grans (Abs): 0 10*3/uL (ref 0.0–0.1)
Immature Granulocytes: 0 %
Lymphocytes Absolute: 2.2 10*3/uL (ref 0.7–3.1)
Lymphs: 28 %
MCH: 31 pg (ref 26.6–33.0)
MCHC: 34.3 g/dL (ref 31.5–35.7)
MCV: 90 fL (ref 79–97)
Monocytes Absolute: 0.5 10*3/uL (ref 0.1–0.9)
Monocytes: 6 %
Neutrophils Absolute: 5 10*3/uL (ref 1.4–7.0)
Neutrophils: 65 %
Platelets: 182 10*3/uL (ref 150–450)
RBC: 3.97 x10E6/uL (ref 3.77–5.28)
RDW: 12.3 % (ref 11.7–15.4)
RPR Ser Ql: NONREACTIVE
Rh Factor: NEGATIVE
Rubella Antibodies, IGG: 1.04 {index} (ref >0.99)
Varicella zoster IgG: NONREACTIVE
WBC: 7.8 10*3/uL (ref 3.4–10.8)

## 2024-04-02 LAB — AB SCR+ANTIBODY ID: Antibody Screen: POSITIVE — AB

## 2024-04-02 LAB — HCV INTERPRETATION

## 2024-04-02 LAB — NICOTINE SCREEN, URINE: Cotinine Ql Scrn, Ur: POSITIVE ng/mL — AB

## 2024-04-03 LAB — URINE CULTURE, OB REFLEX: Organism ID, Bacteria: NO GROWTH

## 2024-04-03 LAB — CULTURE, OB URINE

## 2024-04-05 LAB — MATERNIT 21 PLUS CORE, BLOOD
Fetal Fraction: 9
Result (T21): NEGATIVE
Trisomy 13 (Patau syndrome): NEGATIVE
Trisomy 18 (Edwards syndrome): NEGATIVE
Trisomy 21 (Down syndrome): NEGATIVE

## 2024-04-06 ENCOUNTER — Ambulatory Visit: Payer: Self-pay | Admitting: Certified Nurse Midwife

## 2024-04-06 DIAGNOSIS — O09899 Supervision of other high risk pregnancies, unspecified trimester: Secondary | ICD-10-CM | POA: Insufficient documentation

## 2024-04-06 DIAGNOSIS — Z2839 Other underimmunization status: Secondary | ICD-10-CM | POA: Insufficient documentation

## 2024-04-27 ENCOUNTER — Ambulatory Visit (INDEPENDENT_AMBULATORY_CARE_PROVIDER_SITE_OTHER)

## 2024-04-27 VITALS — BP 93/62 | HR 77 | Wt 164.3 lb

## 2024-04-27 DIAGNOSIS — Z3480 Encounter for supervision of other normal pregnancy, unspecified trimester: Secondary | ICD-10-CM | POA: Diagnosis not present

## 2024-04-27 DIAGNOSIS — Z3401 Encounter for supervision of normal first pregnancy, first trimester: Secondary | ICD-10-CM

## 2024-04-27 DIAGNOSIS — Z3A16 16 weeks gestation of pregnancy: Secondary | ICD-10-CM

## 2024-04-27 NOTE — Assessment & Plan Note (Signed)
-   Reviewed red flag warning signs anticipatory guidance for upcoming prenatal care. - AFP collected.  - Anatomy US  ordered.

## 2024-04-27 NOTE — Progress Notes (Signed)
Body mass index is 26.52 kg/m².

## 2024-04-27 NOTE — Progress Notes (Signed)
    Return Prenatal Note   Assessment/Plan   Plan  18 y.o. G1P0 at [redacted]w[redacted]d presents for follow-up OB visit. Reviewed prenatal record including previous visit note.  Supervision of other normal pregnancy, antepartum - Reviewed red flag warning signs anticipatory guidance for upcoming prenatal care. - AFP collected.  - Anatomy US  ordered.    Orders Placed This Encounter  Procedures   US  OB Comp + 14 Wk    Standing Status:   Future    Expiration Date:   07/28/2024    Reason for Exam (SYMPTOM  OR DIAGNOSIS REQUIRED):   Fetal Anatomic Survey    Preferred Imaging Location?:   Internal   AFP, Serum, Open Spina Bifida    Is patient insulin dependent?:   No    Gestational Age (GA), weeks:   22    Date on which patient was at this GA:   04/26/2024    GA Calculation Method:   LMP    Number of fetuses:   1   Return in about 4 weeks (around 05/25/2024) for ROB.   No future appointments.  For next visit:  continue with routine prenatal care     Subjective   18 y.o. G1P0 at [redacted]w[redacted]d presents for this follow-up prenatal visit.  Patient doing well, has no concerns. Patient reports: Movement: Absent Contractions: Not present  Objective   Flow sheet Vitals: Pulse Rate: 77 BP: 93/62 Fundal Height: 16 cm Fetal Heart Rate (bpm): 155 Total weight gain: -11.2 oz (-0.318 kg)  General Appearance  No acute distress, well appearing, and well nourished Pulmonary   Normal work of breathing Neurologic   Alert and oriented to person, place, and time Psychiatric   Mood and affect within normal limits  Lauraine PARAS Carinne Brandenburger, CNM  06/23/252:54 PM

## 2024-04-29 LAB — AFP, SERUM, OPEN SPINA BIFIDA
AFP MoM: 1.06
AFP Value: 33.7 ng/mL
Gest. Age on Collection Date: 16.1 wk
Maternal Age At EDD: 20 a
OSBR Risk 1 IN: 10000
Test Results:: NEGATIVE
Weight: 164 [lb_av]

## 2024-05-01 ENCOUNTER — Ambulatory Visit: Payer: Self-pay

## 2024-05-17 DIAGNOSIS — N898 Other specified noninflammatory disorders of vagina: Secondary | ICD-10-CM | POA: Diagnosis not present

## 2024-05-17 DIAGNOSIS — Z3A19 19 weeks gestation of pregnancy: Secondary | ICD-10-CM | POA: Diagnosis not present

## 2024-05-17 DIAGNOSIS — J301 Allergic rhinitis due to pollen: Secondary | ICD-10-CM | POA: Diagnosis not present

## 2024-05-17 DIAGNOSIS — O209 Hemorrhage in early pregnancy, unspecified: Secondary | ICD-10-CM | POA: Diagnosis not present

## 2024-05-17 DIAGNOSIS — Z79899 Other long term (current) drug therapy: Secondary | ICD-10-CM | POA: Diagnosis not present

## 2024-05-17 DIAGNOSIS — Z91041 Radiographic dye allergy status: Secondary | ICD-10-CM | POA: Diagnosis not present

## 2024-05-18 NOTE — Telephone Encounter (Signed)
 error

## 2024-05-26 ENCOUNTER — Encounter: Payer: Self-pay | Admitting: Licensed Practical Nurse

## 2024-05-26 ENCOUNTER — Other Ambulatory Visit

## 2024-05-26 ENCOUNTER — Ambulatory Visit (INDEPENDENT_AMBULATORY_CARE_PROVIDER_SITE_OTHER): Admitting: Licensed Practical Nurse

## 2024-05-26 VITALS — BP 105/69 | HR 86 | Wt 169.0 lb

## 2024-05-26 DIAGNOSIS — Z3402 Encounter for supervision of normal first pregnancy, second trimester: Secondary | ICD-10-CM

## 2024-05-26 DIAGNOSIS — Z3401 Encounter for supervision of normal first pregnancy, first trimester: Secondary | ICD-10-CM

## 2024-05-26 DIAGNOSIS — Z3A20 20 weeks gestation of pregnancy: Secondary | ICD-10-CM

## 2024-05-26 DIAGNOSIS — Z369 Encounter for antenatal screening, unspecified: Secondary | ICD-10-CM

## 2024-05-26 DIAGNOSIS — Z3480 Encounter for supervision of other normal pregnancy, unspecified trimester: Secondary | ICD-10-CM

## 2024-05-26 NOTE — Assessment & Plan Note (Addendum)
-  Had anatomy US  today, needs follow up for insufficient views of the heart  -TWG 4, her appetite has increased and nausea has improved, she should see an increase in weight now.  -Discussed CBE and BF classes -May plan a trip to the beach, precautions given -warning signs reviewed

## 2024-05-26 NOTE — Progress Notes (Signed)
    Return Prenatal Note   Subjective   18 y.o. G1P0 at [redacted]w[redacted]d presents for this follow-up prenatal visit.  Patient Here with her boyfriend  Patient reports: Doing ok, the nausea is improving, she can now tolerate food. Her appetite has increased. 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   Movement: Present Contractions: Not present  Objective   Flow sheet Vitals: Pulse Rate: 86 BP: 105/69 Fetal Heart Rate (bpm): 155 Total weight gain: 4 lb (1.814 kg)  General Appearance  No acute distress, well appearing, and well nourished Pulmonary   Normal work of breathing Neurologic   Alert and oriented to person, place, and time Psychiatric   Mood and affect within normal limits  Assessment/Plan   Plan  18 y.o. G1P0 at [redacted]w[redacted]d presents for follow-up OB visit. Reviewed prenatal record including previous visit note.  Supervision of other normal pregnancy, antepartum -Had anatomy US  today, needs follow up for insufficient views of the heart  -TWG 4, her appetite has increased and nausea has improved, she should see an increase in weight now.  -Discussed CBE and BF classes -May plan a trip to the beach, precautions given -warning signs reviewed       Orders Placed This Encounter  Procedures   US  OB Follow Up    Standing Status:   Future    Expected Date:   06/09/2024    Expiration Date:   05/26/2025    Reason for exam::   incomplete heart views    Preferred imaging location?:   Internal   Return in about 4 weeks (around 06/23/2024) for ROB.   Future Appointments  Date Time Provider Department Center  06/15/2024  8:15 AM [facility] US  1 AOB-IMG None  06/23/2024  8:55 AM Han[place] Lust, Jinnie Jansky, CNM [facility] None    For next visit:  continue with routine prenatal care     JINNIE HERO [facility], CNM  05/26/2510:16 AM;a

## 2024-05-31 DIAGNOSIS — L600 Ingrowing nail: Secondary | ICD-10-CM | POA: Diagnosis not present

## 2024-05-31 DIAGNOSIS — L03032 Cellulitis of left toe: Secondary | ICD-10-CM | POA: Diagnosis not present

## 2024-06-15 ENCOUNTER — Ambulatory Visit

## 2024-06-15 DIAGNOSIS — Z3402 Encounter for supervision of normal first pregnancy, second trimester: Secondary | ICD-10-CM

## 2024-06-15 DIAGNOSIS — Z3A23 23 weeks gestation of pregnancy: Secondary | ICD-10-CM

## 2024-06-15 DIAGNOSIS — Z369 Encounter for antenatal screening, unspecified: Secondary | ICD-10-CM

## 2024-06-23 ENCOUNTER — Ambulatory Visit (INDEPENDENT_AMBULATORY_CARE_PROVIDER_SITE_OTHER): Admitting: Licensed Practical Nurse

## 2024-06-23 VITALS — BP 105/68 | HR 87 | Wt 178.3 lb

## 2024-06-23 DIAGNOSIS — Z3480 Encounter for supervision of other normal pregnancy, unspecified trimester: Secondary | ICD-10-CM

## 2024-06-23 DIAGNOSIS — Z3A24 24 weeks gestation of pregnancy: Secondary | ICD-10-CM | POA: Diagnosis not present

## 2024-06-23 DIAGNOSIS — Z3402 Encounter for supervision of normal first pregnancy, second trimester: Secondary | ICD-10-CM

## 2024-06-25 NOTE — Assessment & Plan Note (Addendum)
-  Normal anatomy US  -reviewed rhogam and 28wks labs next visit -TWG 13lbs, WNL  -warning signs reviewed

## 2024-06-25 NOTE — Progress Notes (Signed)
    Return Prenatal Note   Subjective   18 y.o. G1P0 at [redacted]w[redacted]d presents for this follow-up prenatal visit.  Patient  Patient reports:Continues to have back pain while at work, has a support belt, would like to try chiro. Has had Acid reflux-Uses Tums and milk with good results. Tries avoid spicy foods.  Movement: Present Contractions: Not present  Objective   Flow sheet Vitals: Pulse Rate: 87 BP: 105/68 Fundal Height: 24 cm Fetal Heart Rate (bpm): 150 Total weight gain: 13 lb 4.8 oz (6.033 kg)  General Appearance  No acute distress, well appearing, and well nourished Pulmonary   Normal work of breathing Neurologic   Alert and oriented to person, place, and time Psychiatric   Mood and affect within normal limits  Assessment/Plan   Plan  18 y.o. G1P0 at [redacted]w[redacted]d presents for follow-up OB visit. Reviewed prenatal record including previous visit note.  Supervision of other normal pregnancy, antepartum -Normal anatomy US  -reviewed rhogam and 28wks labs next visit -TWG 13lbs, WNL  -warning signs reviewed       Orders Placed This Encounter  Procedures   28 Weeks RH-Panel    Standing Status:   Future    Expected Date:   06/24/2024    Expiration Date:   06/23/2025   Return in about 4 weeks (around 07/21/2024).   Future Appointments  Date Time Provider Department Center  07/21/2024  9:00 AM [facility] None  07/21/2024  9:35 AM Slaughterbeck, Damien, CNM [facility] None    For next visit:  ROB with 1 hour glucola, third trimester labs, and Tdap     Phylisha Dix M Lavante Toso, CNM  08/21/252:45 PM

## 2024-06-27 ENCOUNTER — Observation Stay
Admission: EM | Admit: 2024-06-27 | Discharge: 2024-06-27 | Disposition: A | Discharge status: Home / Self Care | Attending: Certified Nurse Midwife | Admitting: Certified Nurse Midwife

## 2024-06-27 ENCOUNTER — Other Ambulatory Visit: Payer: Self-pay

## 2024-06-27 ENCOUNTER — Encounter: Payer: Self-pay | Admitting: Obstetrics and Gynecology

## 2024-06-27 DIAGNOSIS — O368320 Maternal care for abnormalities of the fetal heart rate or rhythm, second trimester, not applicable or unspecified: Principal | ICD-10-CM | POA: Insufficient documentation

## 2024-06-27 DIAGNOSIS — Z3A24 24 weeks gestation of pregnancy: Secondary | ICD-10-CM | POA: Insufficient documentation

## 2024-06-27 DIAGNOSIS — Z2839 Other underimmunization status: Principal | ICD-10-CM

## 2024-06-27 NOTE — OB Triage Note (Signed)
 LABOR & DELIVERY OB TRIAGE NOTE  SUBJECTIVE  HPI Christina Butler is a 18 y.o. G1P0 at [redacted]w[redacted]d who presents to Labor & Delivery for decreased fetal movement  OB History     Gravida  1   Para      Term      Preterm      AB      Living         SAB      IAB      Ectopic      Multiple      Live Births               OBJECTIVE  BP 112/66 (BP Location: Left Arm)   Pulse 91   Temp 98.4 F (36.9 C) (Oral)   Resp 18   LMP 01/05/2024 (Exact Date)   General:A&O x3 Lungs: normal work of breathing Abdomen: soft, non tender Cervical exam:   deferred  NST I reviewed the NST and it was reactive.  Baseline: 150 Variability: moderate Accelerations: 15x15 Decelerations:none Toco: none Category 1  ASSESSMENT Impression  1) Pregnancy at G1P0, [redacted]w[redacted]d, Estimated Date of Delivery: 10/11/24 2) Reassuring maternal/fetal status 3)Feeling movement in triage  PLAN 1)Stable for discharge  Damien PARSLEY, CNM  06/27/24  1:57 PM

## 2024-06-27 NOTE — OB Triage Note (Signed)
 Patient reports not feeling baby move, good strong kick in about 12 hours, no movement in about 5 hours. Denies contractions, reports a little of cramping. No leaking fluid or vaginal bleeding. No other problems this pregnancy per patient.

## 2024-06-27 NOTE — OB Triage Note (Signed)
 EFM applied and assessing. Initial fetal heart rate 153. Fetal movement palpated and audible upon placement of monitors. Patient reports feeling fetal movement.

## 2024-06-27 NOTE — OB Triage Note (Signed)
 Patient given discharge instructions as per AVS. All questions answered. Patient discharged ambulatory in stable condition.

## 2024-06-27 NOTE — Discharge Instructions (Signed)
 PRETERM LABOR: Includes any of the following symptoms that occur between 20-[redacted] weeks gestation. If these symptoms are not stopped, preterm labor can result in preterm delivery, placing your baby at risk.  Notify your doctor if any of the following occur: 1. Menstrual-like cramps   5. Pelvic pressure  2. Uterine contractions. These may be painless and feel like the uterus is tightening or the baby is balling up 6. Increase or change in vaginal discharge  3. Low, dull backache, unrelieved by heat or Tylenol   7. Vaginal bleeding  4. Intestinal cramps, with our without diarrhea, sometimes 8. A general feeling that something is not right   9. Leaking of fluid described as gas pain

## 2024-07-01 ENCOUNTER — Encounter: Payer: Self-pay | Admitting: Certified Nurse Midwife

## 2024-07-01 ENCOUNTER — Telehealth: Payer: Self-pay

## 2024-07-01 ENCOUNTER — Ambulatory Visit (INDEPENDENT_AMBULATORY_CARE_PROVIDER_SITE_OTHER): Admitting: Certified Nurse Midwife

## 2024-07-01 VITALS — BP 123/73 | HR 93 | Wt 179.0 lb

## 2024-07-01 DIAGNOSIS — Z1329 Encounter for screening for other suspected endocrine disorder: Secondary | ICD-10-CM

## 2024-07-01 DIAGNOSIS — Z3A25 25 weeks gestation of pregnancy: Secondary | ICD-10-CM

## 2024-07-01 DIAGNOSIS — Z3480 Encounter for supervision of other normal pregnancy, unspecified trimester: Secondary | ICD-10-CM | POA: Diagnosis not present

## 2024-07-01 DIAGNOSIS — R55 Syncope and collapse: Secondary | ICD-10-CM | POA: Insufficient documentation

## 2024-07-01 NOTE — Progress Notes (Signed)
    Problem Prenatal Note   Subjective   18 y.o. G1P0 at [redacted]w[redacted]d presents for this follow-up prenatal visit.  Patient reports two episodes of fainting yesterday, first was while she was laying down, second upon standing. This occurred within a short period of time. She denies palpitations, chest pain, shortness of breath, nausea/vomiting or changes to diet or activity. She was reclined at the time of the first episode but was not laying flat. Reports a feeling of ear fullness and vertigo, she does have history of ear infections. Endorses usual fetal movement. Patient reports: Movement: Present Contractions: Not present  Objective   Flow sheet Vitals: Pulse Rate: 93 BP: 123/73 Fetal Heart Rate (bpm): 150 Total weight gain: 14 lb (6.35 kg)  General Appearance  No acute distress, well appearing, and well nourished HENT    Normocephalic, frontal & maxillary sinuses nontender Pulmonary   Normal work of breathing. Lungs CTAB Cardiovascular  RRR, no murmur Neurologic   Alert and oriented to person, place, and time Psychiatric   Mood and affect within normal limits   Assessment/Plan   Plan  18 y.o. G1P0 at [redacted]w[redacted]d presents for follow-up OB visit. Reviewed prenatal record including previous visit note.  Supervision of other normal pregnancy, antepartum Red flag symptoms reviewed. Maintain next ROB/28w labs as scheduled.  Fainting spell CBC, CMP, TSH drawn. Reviewed that this may have occurred due to cardiovascular changes of pregnancy & position, hypoglycemia, or changes to inner ear. If she experiences palpitations, chest pain or shortness of breath present to ED for evaluation. If labs return normal & symptoms persist consider cardiology referral.      Orders Placed This Encounter  Procedures   CBC   TSH Rfx on Abnormal to Free T4   Comprehensive metabolic panel with GFR   No follow-ups on file.   Future Appointments  Date Time Provider Department Center  07/21/2024  9:00 AM  [facility] None  07/21/2024  9:35 AM Slaughterbeck, Damien, CNM [facility] None    For next visit:  continue with routine prenatal care     Harlene LITTIE Cisco, CNM  08/27/254:19 PM

## 2024-07-01 NOTE — Telephone Encounter (Signed)
 Received call on triage line from patient who reports passing out twice yesterday.  She says she was laying on the couch and she started to feel weird, her vision got blurry and dark, and she passed out.  When she came to, she continued to lay down for about an hour.  When she stood up she immediately passed out again.  She denies any injuries.  She says she ate a meal 30 minutes prior to the first episode and has been drinking plenty of water.  Discussed with Harlene Cisco CNM who would like to see patient in clinic today.  Patient given appointment at 3:35pm.  She was told to go to the ED if she has another episode prior to her appointment.  She will have someone drive her to clinic.

## 2024-07-01 NOTE — Assessment & Plan Note (Signed)
 CBC, CMP, TSH drawn. Reviewed that this may have occurred due to cardiovascular changes of pregnancy & position, hypoglycemia, or changes to inner ear. If she experiences palpitations, chest pain or shortness of breath present to ED for evaluation. If labs return normal & symptoms persist consider cardiology referral.

## 2024-07-01 NOTE — Assessment & Plan Note (Signed)
 Red flag symptoms reviewed. Maintain next ROB/28w labs as scheduled.

## 2024-07-02 LAB — COMPREHENSIVE METABOLIC PANEL WITH GFR
ALT: 16 IU/L (ref 0–32)
AST: 16 IU/L (ref 0–40)
Albumin: 4.1 g/dL (ref 4.0–5.0)
Alkaline Phosphatase: 79 IU/L (ref 42–106)
BUN/Creatinine Ratio: 16 (ref 9–23)
BUN: 9 mg/dL (ref 6–20)
Bilirubin Total: <0.2 mg/dL (ref 0.0–1.2)
CO2: 19 mmol/L — ABNORMAL LOW (ref 20–29)
Calcium: 9.8 mg/dL (ref 8.7–10.2)
Chloride: 102 mmol/L (ref 96–106)
Creatinine, Ser: 0.55 mg/dL — ABNORMAL LOW (ref 0.57–1.00)
Globulin, Total: 2.5 g/dL (ref 1.5–4.5)
Glucose: 72 mg/dL (ref 70–99)
Potassium: 4.4 mmol/L (ref 3.5–5.2)
Sodium: 136 mmol/L (ref 134–144)
Total Protein: 6.6 g/dL (ref 6.0–8.5)
eGFR: 135 mL/min/1.73 (ref >59)

## 2024-07-02 LAB — CBC
Hematocrit: 34.9 % (ref 34.0–46.6)
Hemoglobin: 11.7 g/dL (ref 11.1–15.9)
MCH: 31.5 pg (ref 26.6–33.0)
MCHC: 33.5 g/dL (ref 31.5–35.7)
MCV: 94 fL (ref 79–97)
Platelets: 202 x10E3/uL (ref 150–450)
RBC: 3.72 x10E6/uL — ABNORMAL LOW (ref 3.77–5.28)
RDW: 12.5 % (ref 11.7–15.4)
WBC: 12.3 x10E3/uL — ABNORMAL HIGH (ref 3.4–10.8)

## 2024-07-02 LAB — TSH RFX ON ABNORMAL TO FREE T4: TSH: 0.71 u[IU]/mL (ref 0.450–4.500)

## 2024-07-04 ENCOUNTER — Ambulatory Visit: Payer: Self-pay | Admitting: Certified Nurse Midwife

## 2024-07-13 ENCOUNTER — Encounter: Payer: Self-pay | Admitting: Obstetrics & Gynecology

## 2024-07-13 ENCOUNTER — Observation Stay
Admission: EM | Admit: 2024-07-13 | Discharge: 2024-07-13 | Disposition: A | Discharge status: Home / Self Care | Attending: Obstetrics & Gynecology | Admitting: Obstetrics & Gynecology

## 2024-07-13 ENCOUNTER — Other Ambulatory Visit: Payer: Self-pay

## 2024-07-13 DIAGNOSIS — O479 False labor, unspecified: Secondary | ICD-10-CM | POA: Diagnosis present

## 2024-07-13 DIAGNOSIS — Z3A27 27 weeks gestation of pregnancy: Secondary | ICD-10-CM | POA: Diagnosis not present

## 2024-07-13 DIAGNOSIS — O26893 Other specified pregnancy related conditions, third trimester: Secondary | ICD-10-CM | POA: Diagnosis not present

## 2024-07-13 DIAGNOSIS — O09899 Supervision of other high risk pregnancies, unspecified trimester: Principal | ICD-10-CM

## 2024-07-13 DIAGNOSIS — R109 Unspecified abdominal pain: Secondary | ICD-10-CM | POA: Diagnosis not present

## 2024-07-13 NOTE — OB Triage Note (Signed)
 Pt reports to labor and delivery with complaints of contractions. She isn't sure if they are braxton hicks or contractions. She has back pain that radiates to her abdomen. It tightens as hard as a rock. This started this morning and has been on and off throughout the day inconsistently. Has not taken any meds for pain today.   Has been drinking regularly. States that today has been about 60 ounces already. States last intercourse was about 2-3 weeks ago.   Pt denies vaginal bleeding and LOF. States positive fetal movement.   EFM and toco applied and assessing.

## 2024-07-13 NOTE — Final Progress Note (Signed)
 OB/Triage Note  Patient ID: Christina Butler MRN: [id number] DOB/AGE: [birth year] 20 y.o.  Subjective  History of Present Illness: The patient is a 18 y.o. female G1P0 at [redacted]w[redacted]d who presents for concerns about preterm labor. Reports pains that start in her back and radiate to her front since this morning, roughly every but sometimes more frequent and sometimes less frequent. Some pains are very mild and some stronger. Denies VB, LOF, dysuria, frequency, fever. Endorses good FM.    Past Medical History:  Diagnosis Date   Anxiety    Depression     Past Surgical History:  Procedure Laterality Date   TONSILLECTOMY      No current facility-administered medications on file prior to encounter.   Current Outpatient Medications on File Prior to Encounter  Medication Sig Dispense Refill   docusate sodium  (COLACE) 100 MG capsule Take 1 capsule (100 mg total) by mouth 2 (two) times daily as needed. 30 capsule 2   metoCLOPramide  (REGLAN ) 10 MG tablet Take 1 tablet (10 mg total) by mouth every 8 (eight) hours as needed for nausea. 30 tablet 1   ondansetron  (ZOFRAN -ODT) 4 MG disintegrating tablet Take 1 tablet (4 mg total) by mouth every 8 (eight) hours as needed. 30 tablet 1   Prenatal Vit-Fe Fumarate-FA (PRENATAL PO) Take by mouth.      Allergies  Allergen Reactions   Iodine Diarrhea and Nausea Only   Iodinated Contrast Media Nausea And Vomiting    Social History   Socioeconomic History   Marital status: Significant Other    Spouse name: Brycen   Number of children: 0   Years of education: Not on file   Highest education level: Some college, no degree  Occupational History   Occupation: Biomedical scientist, Conservation officer, nature  Tobacco Use   Smoking status: Never    Passive exposure: Yes   Smokeless tobacco: Never   Tobacco comments:    father smokes outside and in the car with the windows down  Vaping Use   Vaping status: Former   Substances: Nicotine   Substance and Sexual Activity    Alcohol use: No   Drug use: Never   Sexual activity: Yes    Partners: Male    Birth control/protection: None  Other Topics Concern   Not on file  Social History Narrative   Not on file   Social Drivers of Health   Financial Resource Strain: Low Risk  (03/06/2024)   Overall Financial Resource Strain (CARDIA)    Difficulty of Paying Living Expenses: Not hard at all  Food Insecurity: No Food Insecurity (03/06/2024)   Hunger Vital Sign    Worried About Running Out of Food in the Last Year: Never true    Ran Out of Food in the Last Year: Never true  Transportation Needs: No Transportation Needs (03/06/2024)   PRAPARE - Administrator, Civil Service (Medical): No    Lack of Transportation (Non-Medical): No  Physical Activity: Sufficiently Active (03/06/2024)   Exercise Vital Sign    Days of Exercise per Week: 4 days    Minutes of Exercise per Session: 60 min  Stress: No Stress Concern Present (03/06/2024)   [employer] of Occupational Health - Occupational Stress Questionnaire    Feeling of Stress : Only a little  Social Connections: Moderately Isolated (03/06/2024)   Social Connection and Isolation Panel    Frequency of Communication with Friends and Family: Three times a week    Frequency of Social Gatherings with Friends and  Family: More than three times a week    Attends Religious Services: Never    Active Member of Clubs or Organizations: No    Attends Banker Meetings: Not on file    Marital Status: Living with partner  Intimate Partner Violence: Not At Risk (03/18/2024)   Received from [facility]   Humiliation, Afraid, Rape, and Kick questionnaire    Within the last year, have you been afraid of your partner or ex-partner?: No    Within the last year, have you been humiliated or emotionally abused in other ways by your partner or ex-partner?: No    Within the last year, have you been kicked, hit, slapped, or otherwise physically hurt by your partner  or ex-partner?: No    Within the last year, have you been raped or forced to have any kind of sexual activity by your partner or ex-partner?: No    Family History  Problem Relation Age of Onset   Diabetes Mother    Healthy Father      ROS    Objective  Physical Exam: BP 109/61 (BP Location: Left Arm)   Pulse 80   Temp 98.3 F (36.8 C) (Oral)   Resp 16   Ht 5' 6 (1.676 m)   Wt 80.7 kg   LMP 01/05/2024 (Exact Date)   BMI 28.73 kg/m   OBGyn Exam  FHT 130, mod variability, pos accels, no decels Toco rare contractions  Sterile speculum exam: cervix closed    Hospital Course: The patient was admitted to [facility] Triage for observation. Patient had one contraction while in triage that she did not feel. Cervix was closed. Labor precautions discussed. She felt comfortable going home, stated she didn't know what contractions feel like.   Assessment: 18 y.o. female G1P0 at [redacted]w[redacted]d  RNST Not in PTL  Plan: Discharge home Follow up at next ROB on Tuesday or sooner as needed Teaching: PTL s/sx  Discharge Instructions     Discharge activity:  No Restrictions   Complete by: As directed    Discharge diet:  No restrictions   Complete by: As directed    Discharge instructions   Complete by: As directed    Follow up with your OB provider at your next ROB   No sexual activity restrictions   Complete by: As directed    Notify physician for a general feeling that something is not right   Complete by: As directed    Notify physician for increase or change in vaginal discharge   Complete by: As directed    Notify physician for intestinal cramps, with or without diarrhea, sometimes described as gas pain   Complete by: As directed    Notify physician for leaking of fluid   Complete by: As directed    Notify physician for low, dull backache, unrelieved by heat or Tylenol   Complete by: As directed    Notify physician for menstrual like cramps   Complete by: As directed     Notify physician for pelvic pressure   Complete by: As directed    Notify physician for uterine contractions.  These may be painless and feel like the uterus is tightening or the baby is  balling up   Complete by: As directed    Notify physician for vaginal bleeding   Complete by: As directed    PRETERM LABOR:  Includes any of the follwing symptoms that occur between 20 - [redacted] weeks gestation.  If these symptoms are not  stopped, preterm labor can result in preterm delivery, placing your baby at risk   Complete by: As directed       Allergies as of 07/13/2024       Reactions   Iodine Diarrhea, Nausea Only   Iodinated Contrast Media Nausea And Vomiting        Medication List     TAKE these medications    docusate sodium  100 MG capsule Commonly known as: COLACE Take 1 capsule (100 mg total) by mouth 2 (two) times daily as needed.   metoCLOPramide  10 MG tablet Commonly known as: REGLAN  Take 1 tablet (10 mg total) by mouth every 8 (eight) hours as needed for nausea.   ondansetron  4 MG disintegrating tablet Commonly known as: ZOFRAN -ODT Take 1 tablet (4 mg total) by mouth every 8 (eight) hours as needed.   PRENATAL PO Take by mouth.         Total time spent taking care of this patient: 45 minutes  Signed: Lolita Loots CNM, FNP 07/13/2024, 6:58 PM

## 2024-07-13 NOTE — OB Triage Note (Signed)

## 2024-07-20 NOTE — Progress Notes (Unsigned)
    Return Prenatal Note   Subjective   18 y.o. G1P0 at [redacted]w[redacted]d presents for this follow-up prenatal visit.  Patient is doing well. She has no new concerns. She has good fetal movement and has been having some Braxton Hick's contractions.  Patient reports: Movement: Present Contractions: Irritability  Objective   Flow sheet Vitals: Pulse Rate: 88 BP: 99/64 Fundal Height: 28 cm Fetal Heart Rate (bpm): 138 Total weight gain: 23 lb 12.8 oz (10.8 kg)  General Appearance  No acute distress, well appearing, and well nourished Pulmonary   Normal work of breathing Neurologic   Alert and oriented to person, place, and time Psychiatric   Mood and affect within normal limits   Assessment/Plan   Plan  18 y.o. G1P0 at [redacted]w[redacted]d presents for follow-up OB visit. Reviewed prenatal record including previous visit note.  Supervision of other normal pregnancy, antepartum 28 week labs drawn today. RhIG given. See MAR RPR, CBC, HIV and 1-hour GTT sent Reviewed the importance of monitoring fetal movement as well as warning signs for pre-eclampsia and preterm labor.       Orders Placed This Encounter  Procedures   Tdap vaccine greater than or equal to 7yo IM   Return in about 4 weeks (around 08/18/2024) for routine prenatal care.   No future appointments.   For next visit:  continue with routine prenatal care     Damien Parsley, CNM [facility] 09/16/259:58 AM

## 2024-07-21 ENCOUNTER — Other Ambulatory Visit

## 2024-07-21 ENCOUNTER — Ambulatory Visit: Admitting: Certified Nurse Midwife

## 2024-07-21 ENCOUNTER — Encounter: Payer: Self-pay | Admitting: Certified Nurse Midwife

## 2024-07-21 ENCOUNTER — Other Ambulatory Visit: Payer: Self-pay

## 2024-07-21 VITALS — BP 99/64 | HR 88 | Wt 188.8 lb

## 2024-07-21 DIAGNOSIS — O219 Vomiting of pregnancy, unspecified: Secondary | ICD-10-CM

## 2024-07-21 DIAGNOSIS — Z3480 Encounter for supervision of other normal pregnancy, unspecified trimester: Secondary | ICD-10-CM

## 2024-07-21 DIAGNOSIS — O26893 Other specified pregnancy related conditions, third trimester: Secondary | ICD-10-CM | POA: Diagnosis not present

## 2024-07-21 DIAGNOSIS — Z3A28 28 weeks gestation of pregnancy: Secondary | ICD-10-CM | POA: Diagnosis not present

## 2024-07-21 DIAGNOSIS — Z23 Encounter for immunization: Secondary | ICD-10-CM

## 2024-07-21 DIAGNOSIS — Z6791 Unspecified blood type, Rh negative: Secondary | ICD-10-CM

## 2024-07-21 DIAGNOSIS — O360130 Maternal care for anti-D [Rh] antibodies, third trimester, not applicable or unspecified: Secondary | ICD-10-CM | POA: Diagnosis not present

## 2024-07-21 DIAGNOSIS — Z130 Encounter for screening for diseases of the blood and blood-forming organs and certain disorders involving the immune mechanism: Secondary | ICD-10-CM

## 2024-07-21 DIAGNOSIS — Z3402 Encounter for supervision of normal first pregnancy, second trimester: Secondary | ICD-10-CM

## 2024-07-21 DIAGNOSIS — Z3A24 24 weeks gestation of pregnancy: Secondary | ICD-10-CM

## 2024-07-21 DIAGNOSIS — Z131 Encounter for screening for diabetes mellitus: Secondary | ICD-10-CM

## 2024-07-21 DIAGNOSIS — Z113 Encounter for screening for infections with a predominantly sexual mode of transmission: Secondary | ICD-10-CM

## 2024-07-21 MED ORDER — RHO D IMMUNE GLOBULIN 1500 UNIT/2ML IJ SOSY
300.0000 ug | PREFILLED_SYRINGE | Freq: Once | INTRAMUSCULAR | Status: AC
  Administered 2024-07-21: 300 ug via INTRAMUSCULAR

## 2024-07-21 MED ORDER — ONDANSETRON HCL 4 MG PO TABS: 4.0000 mg | ORAL_TABLET | Freq: Three times a day (TID) | ORAL | 0 refills | Status: DC | PRN

## 2024-07-21 NOTE — Patient Instructions (Signed)
 Tdap (Tetanus, Diphtheria, Pertussis) Vaccine: What You Need to Know Many vaccine information statements are available in Spanish and other languages. See PromoAge.com.br. 1. Why get vaccinated? Tdap vaccine can prevent tetanus, diphtheria, and pertussis. Diphtheria and pertussis spread from person to person. Tetanus enters the body through cuts or wounds. TETANUS (T) causes painful stiffening of the muscles. Tetanus can lead to serious health problems, including being unable to open the mouth, having trouble swallowing and breathing, or death. DIPHTHERIA (D) can lead to difficulty breathing, heart failure, paralysis, or death. PERTUSSIS (aP), also known as "whooping cough," can cause uncontrollable, violent coughing that makes it hard to breathe, eat, or drink. Pertussis can be extremely serious especially in babies and young children, causing pneumonia, convulsions, brain damage, or death. In teens and adults, it can cause weight loss, loss of bladder control, passing out, and rib fractures from severe coughing. 2. Tdap vaccine Tdap is only for children 7 years and older, adolescents, and adults.  Adolescents should receive a single dose of Tdap, preferably at age 76 or 12 years. Pregnant people should get a dose of Tdap during every pregnancy, preferably during the early part of the third trimester, to help protect the newborn from pertussis. Infants are most at risk for severe, life-threatening complications from pertussis. Adults who have never received Tdap should get a dose of Tdap. Also, adults should receive a booster dose of either Tdap or Td (a different vaccine that protects against tetanus and diphtheria but not pertussis) every 10 years, or after 5 years in the case of a severe or dirty wound or burn. Tdap may be given at the same time as other vaccines. 3. Talk with your health care provider Tell your vaccine provider if the person getting the vaccine: Has had an allergic  reaction after a previous dose of any vaccine that protects against tetanus, diphtheria, or pertussis, or has any severe, life-threatening allergies Has had a coma, decreased level of consciousness, or prolonged seizures within 7 days after a previous dose of any pertussis vaccine (DTP, DTaP, or Tdap) Has seizures or another nervous system problem Has ever had Guillain-Barr Syndrome (also called "GBS") Has had severe pain or swelling after a previous dose of any vaccine that protects against tetanus or diphtheria In some cases, your health care provider may decide to postpone Tdap vaccination until a future visit. People with minor illnesses, such as a cold, may be vaccinated. People who are moderately or severely ill should usually wait until they recover before getting Tdap vaccine.  Your health care provider can give you more information. 4. Risks of a vaccine reaction Pain, redness, or swelling where the shot was given, mild fever, headache, feeling tired, and nausea, vomiting, diarrhea, or stomachache sometimes happen after Tdap vaccination. People sometimes faint after medical procedures, including vaccination. Tell your provider if you feel dizzy or have vision changes or ringing in the ears.  As with any medicine, there is a very remote chance of a vaccine causing a severe allergic reaction, other serious injury, or death. 5. What if there is a serious problem? An allergic reaction could occur after the vaccinated person leaves the clinic. If you see signs of a severe allergic reaction (hives, swelling of the face and throat, difficulty breathing, a fast heartbeat, dizziness, or weakness), call 9-1-1 and get the person to the nearest hospital. For other signs that concern you, call your health care provider.  Adverse reactions should be reported to the Vaccine Adverse Event Reporting  System (VAERS). Your health care provider will usually file this report, or you can do it yourself. Visit the  VAERS website at www.vaers.LAgents.no or call 437-731-6503. VAERS is only for reporting reactions, and VAERS staff members do not give medical advice. 6. The National Vaccine Injury Compensation Program The [employer] Vaccine Injury Compensation Program (VICP) is a federal program that was created to compensate people who may have been injured by certain vaccines. Claims regarding alleged injury or death due to vaccination have a time limit for filing, which may be as short as two years. Visit the VICP website at SpiritualWord.at or call (669) 837-1631 to learn about the program and about filing a claim. 7. How can I learn more? Ask your health care provider. Call your local or state health department. Visit the website of the Food and Drug Administration (FDA) for vaccine package inserts and additional information at FinderList.no. Contact the Centers for Disease Control and Prevention (CDC): Call (484) 483-2759 (1-800-CDC-INFO) or Visit CDC's website at PicCapture.uy. Source: CDC Vaccine Information Statement Tdap (Tetanus, Diphtheria, Pertussis) Vaccine (06/10/2020) This same material is available at FootballExhibition.com.br for no charge. This information is not intended to replace advice given to you by your health care provider. Make sure you discuss any questions you have with your health care provider. Document Revised: 02/06/2023 Document Reviewed: 12/07/2022 Elsevier Patient Education  2024 [employer].

## 2024-07-21 NOTE — Assessment & Plan Note (Signed)
 28 week labs drawn today. RhIG given. See MAR RPR, CBC, HIV and 1-hour GTT sent Reviewed the importance of monitoring fetal movement as well as warning signs for pre-eclampsia and preterm labor.

## 2024-07-24 LAB — 28 WEEKS RH-PANEL
Basophils Absolute: 0 10*3/uL (ref 0.0–0.2)
Basos: 0 %
EOS (ABSOLUTE): 0.1 10*3/uL (ref 0.0–0.4)
Eos: 1 %
Gestational Diabetes Screen: 96 mg/dL (ref 70–139)
HIV Screen 4th Generation wRfx: NONREACTIVE
Hematocrit: 32.2 % — ABNORMAL LOW (ref 34.0–46.6)
Hemoglobin: 10.8 g/dL — ABNORMAL LOW (ref 11.1–15.9)
Immature Grans (Abs): 0.2 10*3/uL — ABNORMAL HIGH (ref 0.0–0.1)
Immature Granulocytes: 2 %
Lymphocytes Absolute: 2.1 10*3/uL (ref 0.7–3.1)
Lymphs: 17 %
MCH: 31.6 pg (ref 26.6–33.0)
MCHC: 33.5 g/dL (ref 31.5–35.7)
MCV: 94 fL (ref 79–97)
Monocytes Absolute: 0.8 10*3/uL (ref 0.1–0.9)
Monocytes: 7 %
Neutrophils Absolute: 9.3 10*3/uL — ABNORMAL HIGH (ref 1.4–7.0)
Neutrophils: 73 %
Platelets: 189 10*3/uL (ref 150–450)
RBC: 3.42 x10E6/uL — ABNORMAL LOW (ref 3.77–5.28)
RDW: 12.3 % (ref 11.7–15.4)
RPR Ser Ql: NONREACTIVE
WBC: 12.5 10*3/uL — ABNORMAL HIGH (ref 3.4–10.8)

## 2024-07-24 LAB — AB SCR+ANTIBODY ID: Antibody Screen: POSITIVE — AB

## 2024-07-28 ENCOUNTER — Ambulatory Visit: Payer: Self-pay | Admitting: Licensed Practical Nurse

## 2024-07-30 ENCOUNTER — Other Ambulatory Visit: Payer: Self-pay

## 2024-07-30 ENCOUNTER — Encounter: Payer: Self-pay | Admitting: Obstetrics and Gynecology

## 2024-07-30 ENCOUNTER — Observation Stay
Admission: EM | Admit: 2024-07-30 | Discharge: 2024-07-30 | Disposition: A | Discharge status: Home / Self Care | Attending: Licensed Practical Nurse | Admitting: Licensed Practical Nurse

## 2024-07-30 DIAGNOSIS — O368130 Decreased fetal movements, third trimester, not applicable or unspecified: Principal | ICD-10-CM

## 2024-07-30 DIAGNOSIS — Z3A29 29 weeks gestation of pregnancy: Secondary | ICD-10-CM | POA: Diagnosis not present

## 2024-07-30 DIAGNOSIS — Z87891 Personal history of nicotine dependence: Secondary | ICD-10-CM | POA: Diagnosis not present

## 2024-07-30 DIAGNOSIS — O09899 Supervision of other high risk pregnancies, unspecified trimester: Principal | ICD-10-CM

## 2024-07-30 DIAGNOSIS — O0001 Abdominal pregnancy with intrauterine pregnancy: Secondary | ICD-10-CM | POA: Diagnosis not present

## 2024-07-30 NOTE — Patient Instructions (Addendum)
 Fetal Movement Counts When you're pregnant, you might start feeling your baby move around the middle of your pregnancy. At first, these movements might feel like flutters, rolls, or swishes. As your baby grows, you might feel more kicks and jabs. Around week 28 of your pregnancy, your health care team may ask you to count how often your baby moves. This is important for all pregnancies, but especially for high-risk ones. Counting movements can help lessen the risk of stillbirth. What is a fetal movement count? A fetal movement count is the number of times that you feel your baby move during a certain amount of time. This may also be called a kick count. There are many ways to do a kick count. Ask your team what is best for you. Pay attention to when your baby is most active. You may notice your baby's sleep and wake cycles. You may also notice things that make your baby move more. When you do a kick count, try to do it: When your baby is normally most active. At the same time each day. How do I count fetal movements?  Find a quiet, comfortable area. Sit or lie down. Write down the date, the start time, and the number of movements you feel. Count kicks, flutters, swishes, rolls, and jabs. Usually, you will feel at least 10 movements within 2 hours. Stop counting after you have felt 10 movements or if you have been counting for 2 hours. Write down the stop time. Contact a health care provider if: You don't feel 10 movements in 2 hours. Your baby isn't moving as it usually does. Your baby isn't moving at all. If you're not able to reach your provider, go to an emergency room. This information is not intended to replace advice given to you by your health care provider. Make sure you discuss any questions you have with your health care provider. Document Revised: 11/15/2023 Document Reviewed: 11/07/2022 Elsevier Patient Education  2025 Elsevier Inc.Third Trimester of Pregnancy  The third trimester  of pregnancy is from week 28 through week 40. This is months 7 through 9. The third trimester is a time when your baby is growing fast. Body changes during your third trimester Your body continues to change during this time. The changes usually go away after your baby is born. Physical changes You will continue to gain weight. You may get stretch marks on your hips, belly, and breasts. Your breasts will keep growing and may hurt. A yellow fluid (colostrum) may leak from your breasts. This is the first milk you're making for your baby. Your hair may grow faster and get thicker. In some cases, you may get hair loss. Your belly button may stick out. You may have more swelling in your hands, face, or ankles. Health changes You may have heartburn. You may feel short of breath. This is caused by the uterus that is now bigger. You may have more aches in the pelvis, back, or thighs. You may have more tingling or numbness in your hands, arms, and legs. You may pee more often. You may have trouble pooping (constipation) or swollen veins in the butt that can itch or get painful (hemorrhoids). Other changes You may have more problems sleeping. You may notice the baby moving lower in your belly (dropping). You may have more fluid coming from your vagina. Your joints may feel loose, and you may have pain around your pelvic bone. Follow these instructions at home: Medicines Take medicines only as told by  your health care provider. Some medicines are not safe during pregnancy. Your provider may change the medicines that you take. Do not take any medicines unless told to by your provider. Take a prenatal vitamin that has at least 600 micrograms (mcg) of folic acid. Do not use herbal medicines, illegal drugs, or medicines that are not approved by your provider. Eating and drinking While you're pregnant your body needs additional nutrition to help support your growing baby. Talk with your provider about  your nutritional needs. Activity Most women are able to exercise regularly during pregnancy. Exercise routines may need to change at the end of your pregnancy. Talk to your provider about your activities and exercise routine. Relieving pain and discomfort Rest often with your legs raised if you have leg cramps or low back pain. Take warm sitz baths to soothe pain from hemorrhoids. Use hemorrhoid cream if your provider says it's okay. Wear a good, supportive bra if your breasts hurt. Do not use hot tubs, steam rooms, or saunas. Do not douche. Do not use tampons or scented pads. Safety Talk to your provider before traveling far distances. Wear your seatbelt at all times when you're in a car. Talk to your provider if someone hits you, hurts you, or yells at you. Preparing for birth To prepare for your baby: Take childbirth and breastfeeding classes. Visit the hospital and tour the maternity area. Buy a rear-facing car seat. Learn how to install it in your car. General instructions Avoid cat litter boxes and soil used by cats. These things carry germs that can cause harm to your pregnancy and your baby. Do not drink alcohol, smoke, vape, or use products with nicotine  or tobacco in them. If you need help quitting, talk with your provider. Keep all follow-up visits for your third trimester. Your provider will do more exams and tests during this trimester. Write down your questions. Take them to your prenatal visits. Your provider also will: Talk with you about your overall health. Give you advice or refer you to specialists who can help with different needs, including: Mental health and counseling. Foods and healthy eating. Ask for help if you need help with food. Where to find more information American Pregnancy Association: americanpregnancy.org [employer] of [employer]stetricians and Gynecologists: acog.org Office on [employer] Health: [employer] Contact a health care provider if: You  have a headache that does not go away when you take medicine. You have any of these problems: You can't eat or drink. You have nausea and vomiting. You have watery poop (diarrhea) for 2 days or more. You have pain when you pee, or your pee smells bad. You have been sick for 2 days or more and aren't getting better. Contact your provider right away if: You have any of these coming from your vagina: Abnormal discharge. Bad-smelling fluid. Bleeding. Your baby is moving less than usual. You have signs of labor: You have any contractions, belly cramping, or have pain in your pelvis or lower back before 37 weeks of pregnancy (preterm labor). You have regular contractions that are less than 5 minutes apart. Your water breaks. You have symptoms of high blood pressure or preeclampsia. These include: A severe, throbbing headache that does not go away. Sudden or extreme swelling of your face, hands, legs, or feet. Vision problems: You see spots. You have blurry vision. Your eyes are sensitive to light. If you can't reach your provider, go to an urgent care or emergency room. Get help right away if: You faint, become  confused, or can't think clearly. You have chest pain or trouble breathing. You have any kind of injury, such as from a fall or a car crash. These symptoms may be an emergency. Call 911 right away. Do not wait to see if the symptoms will go away. Do not drive yourself to the hospital. This information is not intended to replace advice given to you by your health care provider. Make sure you discuss any questions you have with your health care provider. Document Revised: 07/25/2023 Document Reviewed: 02/22/2023 Elsevier Patient Education  2024 [employer].

## 2024-07-30 NOTE — OB Triage Note (Addendum)
 Christina Butler 18 y.o. @[redacted]w[redacted]d  G1P0  presents to Labor & Delivery triage via wheelchair steered by ED staff reporting decreased fetal movement since earlier yesterday morning. She reports that she has pregnancy insomnia and he is most active at night but he has not been tonight. No other OB complaints. She denies signs and symptoms consistent with rupture of membranes or active vaginal bleeding. She denies contractions. External FM and TOCO applied to non-tender abdomen. Initial FHR 125. Vital signs obtained and within normal limits. Patient oriented to care environment including call bell and bed control use. Dominic, CNM notified of patient's arrival.  Meoshia Billing L. Geofm, RN BSN 07/30/2024 12:32 AM

## 2024-07-30 NOTE — Discharge Summary (Signed)
 Physician Final Progress Note  Patient ID: Christina Butler MRN: [id number] DOB/AGE: [birth year] 18 y.o.  Admit date: 07/30/2024 Admitting provider: Debby JINNY Dinsmore, MD Discharge date: 07/30/2024   Admission Diagnoses:  1) intrauterine pregnancy at [redacted]w[redacted]d  2) decreased fetal movement   Discharge Diagnoses:  Principal Problem:   Indication for care in labor and delivery, antepartum  RNST   History of Present Illness: The patient is a 18 y.o. female G1P0 at [redacted]w[redacted]d who presents for decreased fetal movement. Christina Butler reports the fetus is always most active at night, this evening while sitting in bed she noticed the fetus has not moved, she poked at her belly, drank a soda and went for a walk and did not feel movement. Typically she feels some movement though out the day and a lot of movement at night, in addition to not feeling movement this evening, she did not feel the fetus move as much as it normally does during the day. On arriving to the until she reported feeling movement. Denies contractions or Lof/VB.   Past Medical History:  Diagnosis Date   Anxiety    Depression     Past Surgical History:  Procedure Laterality Date   TONSILLECTOMY      No current facility-administered medications on file prior to encounter.   Current Outpatient Medications on File Prior to Encounter  Medication Sig Dispense Refill   Prenatal Vit-Fe Fumarate-FA (PRENATAL PO) Take by mouth.     docusate sodium  (COLACE) 100 MG capsule Take 1 capsule (100 mg total) by mouth 2 (two) times daily as needed. 30 capsule 2   ondansetron  (ZOFRAN ) 4 MG tablet Take 1 tablet (4 mg total) by mouth every 8 (eight) hours as needed for nausea or vomiting. 20 tablet 0    Allergies  Allergen Reactions   Iodine Diarrhea and Nausea Only   Iodinated Contrast Media Nausea And Vomiting    Social History   Socioeconomic History   Marital status: Significant Other    Spouse name: Christina Butler   Number of children: 0   Years  of education: Not on file   Highest education level: Some college, no degree  Occupational History   Occupation: Biomedical scientist, Conservation officer, nature  Tobacco Use   Smoking status: Never    Passive exposure: Yes   Smokeless tobacco: Never   Tobacco comments:    father smokes outside and in the car with the windows down  Vaping Use   Vaping status: Former   Substances: Nicotine   Substance and Sexual Activity   Alcohol use: No   Drug use: Never   Sexual activity: Yes    Partners: Male    Birth control/protection: None    Comment: undecided  Other Topics Concern   Not on file  Social History Narrative   Not on file   Social Drivers of Health   Financial Resource Strain: Low Risk  (03/06/2024)   Overall Financial Resource Strain (CARDIA)    Difficulty of Paying Living Expenses: Not hard at all  Food Insecurity: No Food Insecurity (03/06/2024)   Hunger Vital Sign    Worried About Running Out of Food in the Last Year: Never true    Ran Out of Food in the Last Year: Never true  Transportation Needs: No Transportation Needs (03/06/2024)   PRAPARE - Administrator, Civil Service (Medical): No    Lack of Transportation (Non-Medical): No  Physical Activity: Sufficiently Active (03/06/2024)   Exercise Vital Sign    Days of Exercise per  Week: 4 days    Minutes of Exercise per Session: 60 min  Stress: No Stress Concern Present (03/06/2024)   Christina [facility] [employer] - Occupational Stress Questionnaire    Feeling of Stress : Only a little  Social Connections: Moderately Isolated (03/06/2024)   Social Connection and Isolation Panel    Frequency of Communication with Friends and Family: Three times a week    Frequency of Social Gatherings with Friends and Family: More than three times a week    Attends Religious Services: Never    Database administrator or Organizations: No    Attends Engineer, structural: Not on file    Marital Status: Living with partner  Intimate Partner  Violence: Not At Risk (03/18/2024)   Received from [facility]   Humiliation, Afraid, Rape, and Kick questionnaire    Within the last year, have you been afraid of your partner or ex-partner?: No    Within the last year, have you been humiliated or emotionally abused in other ways by your partner or ex-partner?: No    Within the last year, have you been kicked, hit, slapped, or otherwise physically hurt by your partner or ex-partner?: No    Within the last year, have you been raped or forced to have any kind of sexual activity by your partner or ex-partner?: No    Family History  Problem Relation Age of Onset   Diabetes Mother    Healthy Father      ROS see  HPI   Physical Exam: Temp 98.3 F (36.8 C) (Oral)   Resp 16   LMP 01/05/2024 (Exact Date)   Physical Exam Constitutional:      Appearance: Normal appearance.  Cardiovascular:     Rate and Rhythm: Normal rate.  Pulmonary:     Effort: Pulmonary effort is normal.  Musculoskeletal:     Cervical back: Normal range of motion.  Neurological:     General: No focal deficit present.     Mental Status: She is alert.  Psychiatric:        Mood and Affect: Mood normal.        Thought Content: Thought content normal.    Christina Butler 2006/06/22 [redacted]w[redacted]d  Fetus A Non-Stress Test Interpretation for 07/30/24  Indication: Decreased Fetal Movement  Fetal Heart Rate A Mode: External Baseline Rate (A): 125 bpm Variability: Moderate Accelerations: 15 x 15 Decelerations: None  Uterine Activity Mode: Toco Contraction Frequency (min): UI/none noted      Consults: None  Significant Findings/ Diagnostic Studies: NA  Procedures: RNST  Hospital Course: The patient was admitted to Labor and Delivery Triage for observation. She had a RNST and reported feeling movement. Pr desired to go home. Will keep scheduled ROB on Tuesday.  Discharge Condition: good  Disposition: Discharge disposition: 01-Home or Self  Care       Diet: Regular diet  Discharge Activity: Activity as tolerated   Allergies as of 07/30/2024       Reactions   Iodine Diarrhea, Nausea Only   Iodinated Contrast Media Nausea And Vomiting        Medication List     TAKE these medications    docusate sodium  100 MG capsule Commonly known as: COLACE Take 1 capsule (100 mg total) by mouth 2 (two) times daily as needed.   ondansetron  4 MG tablet Commonly known as: Zofran  Take 1 tablet (4 mg total) by mouth every 8 (eight) hours as needed for nausea or vomiting.  PRENATAL PO Take by mouth.         Total time spent taking care of this patient: 10 minutes  Signed: JINNIE HERO [facility], CNM  07/30/2024, 12:51 AM

## 2024-08-04 ENCOUNTER — Ambulatory Visit (INDEPENDENT_AMBULATORY_CARE_PROVIDER_SITE_OTHER): Admitting: Obstetrics

## 2024-08-04 VITALS — BP 116/64 | HR 87 | Wt 190.2 lb

## 2024-08-04 DIAGNOSIS — Z23 Encounter for immunization: Secondary | ICD-10-CM

## 2024-08-04 DIAGNOSIS — O99013 Anemia complicating pregnancy, third trimester: Secondary | ICD-10-CM | POA: Insufficient documentation

## 2024-08-04 DIAGNOSIS — D649 Anemia, unspecified: Secondary | ICD-10-CM | POA: Diagnosis not present

## 2024-08-04 DIAGNOSIS — Z3403 Encounter for supervision of normal first pregnancy, third trimester: Secondary | ICD-10-CM

## 2024-08-04 DIAGNOSIS — Z3A30 30 weeks gestation of pregnancy: Secondary | ICD-10-CM | POA: Diagnosis not present

## 2024-08-04 DIAGNOSIS — O99019 Anemia complicating pregnancy, unspecified trimester: Secondary | ICD-10-CM | POA: Insufficient documentation

## 2024-08-04 MED ORDER — FERROUS SULFATE 325 (65 FE) MG PO TBEC: 325.0000 mg | DELAYED_RELEASE_TABLET | Freq: Every day | ORAL | 1 refills | Status: DC

## 2024-08-04 NOTE — Progress Notes (Signed)
    Return Prenatal Note   Subjective  18 y.o. G1P0 at [redacted]w[redacted]d presents for this follow-up prenatal visit.   Patient reports BH contractions about twice a day that make it difficult to breathe.  Would like flu shot today. Was in OBT on 9/25 for decreased fetal movement, had rNST and DCed home. States she wants all the drugs including epidural for her labor/birth and doesn't really want to make a birth plan, just wants to go with the flow. Her mom and boyfriend will be her support people during labor.   Patient reports: Movement: Present Contractions: Irregular Denies vaginal bleeding or leaking fluid. Objective  Flow sheet Vitals: Pulse Rate: 87 BP: 116/64 Total weight gain: 25 lb 3.2 oz (11.4 kg)  General Appearance  No acute distress, well appearing, and well nourished Pulmonary   Normal work of breathing Neurologic   Alert and oriented to person, place, and time Psychiatric   Mood and affect within normal limits   Assessment/Plan   Plan  18 y.o. G1P0 at [redacted]w[redacted]d by LMP=9wk US  presents for follow-up OB visit. Reviewed prenatal record including previous visit note.  1. Encounter for supervision of normal first pregnancy in third trimester (Primary) -Flu vaccine given today -Reviewed 3rd trimester anticipatory guidance -Planning epidural, fluid birth plan, mom and BF will be support in room.   2. Antepartum anemia -28wk Hgb = 10.8, start Iron daily, Rx sent  Return in about 2 weeks (around 08/18/2024) for ROB.   Future Appointments  Date Time Provider Department Center  08/18/2024  8:15 AM Charma Domino, CNM [facility] None    For next visit:  continue with routine prenatal care   Estil Mangle, DO [facility]

## 2024-08-14 NOTE — Progress Notes (Signed)
    Return Prenatal Note   Subjective   18 y.o. G1P0 at [redacted]w[redacted]d presents for this follow-up prenatal visit.  Patient feels regular fetal movement. Some abdominal cramping and back ache at the end of the day. Taking daily iron supplement consistently. Has had reflux that predates pregnancy. Has been a little worse, but it's manageable. Got RSV vax today. Expecting baby [place].  Patient reports: Movement: Present Contractions: Irritability  Objective   Flow sheet Vitals: Pulse Rate: (!) 109 BP: 114/68 Fundal Height: 34 cm Fetal Heart Rate (bpm): 160 Total weight gain: 30 lb 1.6 oz (13.7 kg)  General Appearance  No acute distress, well appearing, and well nourished Pulmonary   Normal work of breathing Neurologic   Alert and oriented to person, place, and time Psychiatric   Mood and affect within normal limits   Assessment/Plan   Plan  18 y.o. G1P0 at [redacted]w[redacted]d presents for follow-up OB visit. Reviewed prenatal record including previous visit note.  Encounter for supervision of normal first pregnancy in third trimester Reviewed kick counts and preterm labor warning signs. Instructed to call office or come to hospital with persistent headache, vision changes, regular contractions, leaking of fluid, decreased fetal movement or vaginal bleeding.   Antepartum anemia Continue daily Fe supplementation CBC today      Orders Placed This Encounter  Procedures   Respiratory syncytial virus vaccine, preF, subunit, bivalent,(Abrysvo)   CBC   Return in about 1 week (around 08/25/2024) for ROB.   No future appointments.   For next visit:  continue with routine prenatal care     Lauraine Lakes, CNM

## 2024-08-18 ENCOUNTER — Ambulatory Visit (INDEPENDENT_AMBULATORY_CARE_PROVIDER_SITE_OTHER): Admitting: Registered Nurse

## 2024-08-18 ENCOUNTER — Encounter: Payer: Self-pay | Admitting: Registered Nurse

## 2024-08-18 VITALS — BP 114/68 | HR 109 | Wt 195.1 lb

## 2024-08-18 DIAGNOSIS — Z23 Encounter for immunization: Secondary | ICD-10-CM

## 2024-08-18 DIAGNOSIS — O99013 Anemia complicating pregnancy, third trimester: Secondary | ICD-10-CM

## 2024-08-18 DIAGNOSIS — D649 Anemia, unspecified: Secondary | ICD-10-CM

## 2024-08-18 DIAGNOSIS — Z3A32 32 weeks gestation of pregnancy: Secondary | ICD-10-CM

## 2024-08-18 DIAGNOSIS — Z2911 Encounter for prophylactic immunotherapy for respiratory syncytial virus (RSV): Secondary | ICD-10-CM

## 2024-08-18 DIAGNOSIS — O99019 Anemia complicating pregnancy, unspecified trimester: Secondary | ICD-10-CM

## 2024-08-18 DIAGNOSIS — Z3403 Encounter for supervision of normal first pregnancy, third trimester: Secondary | ICD-10-CM

## 2024-08-18 MED ORDER — DOCUSATE SODIUM 100 MG PO CAPS: 100.0000 mg | ORAL_CAPSULE | Freq: Two times a day (BID) | ORAL | 2 refills | Status: DC | PRN

## 2024-08-18 NOTE — Assessment & Plan Note (Signed)
 Reviewed kick counts and preterm labor warning signs. Instructed to call office or come to hospital with persistent headache, vision changes, regular contractions, leaking of fluid, decreased fetal movement or vaginal bleeding.

## 2024-08-18 NOTE — Assessment & Plan Note (Signed)
 Continue daily Fe supplementation CBC today

## 2024-08-19 LAB — CBC
Hematocrit: 30.7 % — ABNORMAL LOW (ref 34.0–46.6)
Hemoglobin: 10.3 g/dL — ABNORMAL LOW (ref 11.1–15.9)
MCH: 31.2 pg (ref 26.6–33.0)
MCHC: 33.6 g/dL (ref 31.5–35.7)
MCV: 93 fL (ref 79–97)
Platelets: 195 x10E3/uL (ref 150–450)
RBC: 3.3 x10E6/uL — ABNORMAL LOW (ref 3.77–5.28)
RDW: 12.2 % (ref 11.7–15.4)
WBC: 12.1 x10E3/uL — ABNORMAL HIGH (ref 3.4–10.8)

## 2024-08-24 ENCOUNTER — Observation Stay
Admission: EM | Admit: 2024-08-24 | Discharge: 2024-08-24 | Disposition: A | Discharge status: Home / Self Care | Attending: Certified Nurse Midwife | Admitting: Certified Nurse Midwife

## 2024-08-24 ENCOUNTER — Encounter: Payer: Self-pay | Admitting: Certified Nurse Midwife

## 2024-08-24 DIAGNOSIS — N898 Other specified noninflammatory disorders of vagina: Secondary | ICD-10-CM

## 2024-08-24 DIAGNOSIS — O23593 Infection of other part of genital tract in pregnancy, third trimester: Principal | ICD-10-CM | POA: Insufficient documentation

## 2024-08-24 DIAGNOSIS — O26893 Other specified pregnancy related conditions, third trimester: Secondary | ICD-10-CM

## 2024-08-24 DIAGNOSIS — Z3A33 33 weeks gestation of pregnancy: Secondary | ICD-10-CM

## 2024-08-24 DIAGNOSIS — O368330 Maternal care for abnormalities of the fetal heart rate or rhythm, third trimester, not applicable or unspecified: Secondary | ICD-10-CM | POA: Diagnosis not present

## 2024-08-24 LAB — URINALYSIS, ROUTINE W REFLEX MICROSCOPIC
Bilirubin Urine: NEGATIVE
Glucose, UA: NEGATIVE mg/dL
Hgb urine dipstick: NEGATIVE
Ketones, ur: NEGATIVE mg/dL
Leukocytes,Ua: NEGATIVE
Nitrite: NEGATIVE
Protein, ur: NEGATIVE mg/dL
Specific Gravity, Urine: 1.02 (ref 1.005–1.030)
pH: 7 (ref 5.0–8.0)

## 2024-08-24 LAB — WET PREP, GENITAL
Clue Cells Wet Prep HPF POC: NONE SEEN
Sperm: NONE SEEN
Trich, Wet Prep: NONE SEEN
WBC, Wet Prep HPF POC: >=10 — AB (ref <10)
Yeast Wet Prep HPF POC: NONE SEEN

## 2024-08-24 NOTE — OB Triage Provider Note (Signed)
   L&D OB Triage Note  SUBJECTIVE Christina Butler is a 18 y.o. G1P0 female at [redacted]w[redacted]d, EDD Estimated Date of Delivery: 10/11/24 who presented to triage with complaints of vaginal discharge.  She denies loss of fluid, vaginal bleeding , and contractions. She is feeling good fetal movement.   OB History  Gravida Para Term Preterm AB Living  1 0 0 0 0 0  SAB IAB Ectopic Multiple Live Births  0 0 0 0 0    # Outcome Date GA Lbr Len/2nd Weight Sex Type Anes PTL Lv  1 Current             Medications Prior to Admission  Medication Sig Dispense Refill Last Dose/Taking   docusate sodium  (COLACE) 100 MG capsule Take 1 capsule (100 mg total) by mouth 2 (two) times daily as needed. 30 capsule 2    ferrous sulfate 325 (65 FE) MG EC tablet Take 1 tablet (325 mg total) by mouth daily. 90 tablet 1    mupirocin ointment (BACTROBAN) 2 % Apply topically 2 (two) times daily. (Patient not taking: Reported on 08/18/2024)      ondansetron  (ZOFRAN ) 4 MG tablet Take 1 tablet (4 mg total) by mouth every 8 (eight) hours as needed for nausea or vomiting. 20 tablet 0    Prenatal Vit-Fe Fumarate-FA (PRENATAL PO) Take by mouth.        OBJECTIVE  Nursing Evaluation:   BP 121/67   Pulse 98   Temp 98.2 F (36.8 C) (Tympanic)   Resp 16   LMP 01/05/2024 (Exact Date)    Findings:   Normal discharge in pregnancy      NST was performed and has been reviewed by me.  NST INTERPRETATION: Category I   Baseline 130s Moderate variability Accelerations present Declarations absent Ctx. absent ASSESSMENT Impression:  1.  Pregnancy:  G1P0 at [redacted]w[redacted]d , EDD Estimated Date of Delivery: 10/11/24 2.  Reassuring fetal and maternal status 3.  Wet prep negative. Gc/chlamydia pending results.   PLAN 1. Current condition and above findings reviewed.  Reassuring fetal and maternal condition. Will follow up with gc/chlamydia results .  2. Discharge home with standard labor precautions given to return to L&D or call the office for  problems. 3. Continue routine prenatal care.  Zelda Hummer, CNM

## 2024-08-24 NOTE — OB Triage Note (Signed)
 Pt is G1P0 AT 33.1 with c/o mild UI x 2 days and today she had some mucous and pink tinged when wiping. Pt denies any problems with pregnancy. Healthy. FHR 135 +FM

## 2024-08-25 LAB — CHLAMYDIA/NGC RT PCR (ARMC ONLY)
Chlamydia Tr: NOT DETECTED
N gonorrhoeae: NOT DETECTED

## 2024-08-31 ENCOUNTER — Other Ambulatory Visit: Payer: Self-pay | Admitting: Certified Nurse Midwife

## 2024-08-31 ENCOUNTER — Telehealth: Payer: Self-pay

## 2024-08-31 DIAGNOSIS — O219 Vomiting of pregnancy, unspecified: Secondary | ICD-10-CM

## 2024-08-31 NOTE — Telephone Encounter (Signed)
 Christina Butler called triage stating she very dizzy even while sitting, stomach pains, and states she can't barely breathe. I did advise her to go L & D since she stated she can't barely breathe

## 2024-09-01 NOTE — Patient Instructions (Signed)
 Third Trimester of Pregnancy  The third trimester of pregnancy is from week 28 through week 40. This is months 7 through 9. The third trimester is a time when your baby is growing fast. Body changes during your third trimester Your body continues to change during this time. The changes usually go away after your baby is born. Physical changes You will continue to gain weight. You may get stretch marks on your hips, belly, and breasts. Your breasts will keep growing and may hurt. A yellow fluid (colostrum) may leak from your breasts. This is the first milk you're making for your baby. Your hair may grow faster and get thicker. In some cases, you may get hair loss. Your belly button may stick out. You may have more swelling in your hands, face, or ankles. Health changes You may have heartburn. You may feel short of breath. This is caused by the uterus that is now bigger. You may have more aches in the pelvis, back, or thighs. You may have more tingling or numbness in your hands, arms, and legs. You may pee more often. You may have trouble pooping (constipation) or swollen veins in the butt that can itch or get painful (hemorrhoids). Other changes You may have more problems sleeping. You may notice the baby moving lower in your belly (dropping). You may have more fluid coming from your vagina. Your joints may feel loose, and you may have pain around your pelvic bone. Follow these instructions at home: Medicines Take medicines only as told by your health care provider. Some medicines are not safe during pregnancy. Your provider may change the medicines that you take. Do not take any medicines unless told to by your provider. Take a prenatal vitamin that has at least 600 micrograms (mcg) of folic acid . Do not use herbal medicines, illegal drugs, or medicines that are not approved by your provider. Eating and drinking While you're pregnant your body needs additional nutrition to help  support your growing baby. Talk with your provider about your nutritional needs. Activity Most women are able to exercise regularly during pregnancy. Exercise routines may need to change at the end of your pregnancy. Talk to your provider about your activities and exercise routine. Relieving pain and discomfort Rest often with your legs raised if you have leg cramps or low back pain. Take warm sitz baths to soothe pain from hemorrhoids. Use hemorrhoid cream if your provider says it's okay. Wear a good, supportive bra if your breasts hurt. Do not use hot tubs, steam rooms, or saunas. Do not douche. Do not use tampons or scented pads. Safety Talk to your provider before traveling far distances. Wear your seatbelt at all times when you're in a car. Talk to your provider if someone hits you, hurts you, or yells at you. Preparing for birth To prepare for your baby: Take childbirth and breastfeeding classes. Visit the hospital and tour the maternity area. Buy a rear-facing car seat. Learn how to install it in your car. General instructions Avoid cat litter boxes and soil used by cats. These things carry germs that can cause harm to your pregnancy and your baby. Do not drink alcohol, smoke, vape, or use products with nicotine  or tobacco in them. If you need help quitting, talk with your provider. Keep all follow-up visits for your third trimester. Your provider will do more exams and tests during this trimester. Write down your questions. Take them to your prenatal visits. Your provider also will: Talk with you about  your overall health. Give you advice or refer you to specialists who can help with different needs, including: Mental health and counseling. Foods and healthy eating. Ask for help if you need help with food. Where to find more information American Pregnancy Association: americanpregnancy.org [employer] of Obstetricians and Gynecologists: acog.org Office on [employer] Health:  [employer] Contact a health care provider if: You have a headache that does not go away when you take medicine. You have any of these problems: You can't eat or drink. You have nausea and vomiting. You have watery poop (diarrhea) for 2 days or more. You have pain when you pee, or your pee smells bad. You have been sick for 2 days or more and aren't getting better. Contact your provider right away if: You have any of these coming from your vagina: Abnormal discharge. Bad-smelling fluid. Bleeding. Your baby is moving less than usual. You have signs of labor: You have any contractions, belly cramping, or have pain in your pelvis or lower back before 37 weeks of pregnancy (preterm labor). You have regular contractions that are less than 5 minutes apart. Your water breaks. You have symptoms of high blood pressure or preeclampsia. These include: A severe, throbbing headache that does not go away. Sudden or extreme swelling of your face, hands, legs, or feet. Vision problems: You see spots. You have blurry vision. Your eyes are sensitive to light. If you can't reach your provider, go to an urgent care or emergency room. Get help right away if: You faint, become confused, or can't think clearly. You have chest pain or trouble breathing. You have any kind of injury, such as from a fall or a car crash. These symptoms may be an emergency. Call 911 right away. Do not wait to see if the symptoms will go away. Do not drive yourself to the hospital. This information is not intended to replace advice given to you by your health care provider. Make sure you discuss any questions you have with your health care provider. Document Revised: 07/25/2023 Document Reviewed: 02/22/2023 Elsevier Patient Education  2024 Elsevier Inc.Fetal Movement Counts When you're pregnant, you might start feeling your baby move around the middle of your pregnancy. At first, these movements might feel like  flutters, rolls, or swishes. As your baby grows, you might feel more kicks and jabs. Around week 28 of your pregnancy, your health care team may ask you to count how often your baby moves. This is important for all pregnancies, but especially for high-risk ones. Counting movements can help lessen the risk of stillbirth. What is a fetal movement count? A fetal movement count is the number of times that you feel your baby move during a certain amount of time. This may also be called a kick count. There are many ways to do a kick count. Ask your team what is best for you. Pay attention to when your baby is most active. You may notice your baby's sleep and wake cycles. You may also notice things that make your baby move more. When you do a kick count, try to do it: When your baby is normally most active. At the same time each day. How do I count fetal movements?  Find a quiet, comfortable area. Sit or lie down. Write down the date, the start time, and the number of movements you feel. Count kicks, flutters, swishes, rolls, and jabs. Usually, you will feel at least 10 movements within 2 hours. Stop counting after you have felt 10 movements  or if you have been counting for 2 hours. Write down the stop time. Contact a health care provider if: You don't feel 10 movements in 2 hours. Your baby isn't moving as it usually does. Your baby isn't moving at all. If you're not able to reach your provider, go to an emergency room. This information is not intended to replace advice given to you by your health care provider. Make sure you discuss any questions you have with your health care provider. Document Revised: 11/15/2023 Document Reviewed: 11/07/2022 Elsevier Patient Education  2025 [employer].

## 2024-09-01 NOTE — Progress Notes (Unsigned)
    Return Prenatal Note   Subjective   18 y.o. G1P0 at [redacted]w[redacted]d presents for this follow-up prenatal visit.  Patient is doing well. She reports good fetal movement. She has no new concerns today. Patient reports: Movement: Present Contractions: Irritability  Objective   Flow sheet Vitals: Pulse Rate: (!) 116 BP: 107/61 Fundal Height: 34 cm Fetal Heart Rate (bpm): 128 Total weight gain: 37 lb 9.6 oz (17.1 kg)  General Appearance  No acute distress, well appearing, and well nourished Pulmonary   Normal work of breathing Neurologic   Alert and oriented to person, place, and time Psychiatric   Mood and affect within normal limits   Assessment/Plan   Plan  18 y.o. G1P0 at [redacted]w[redacted]d presents for follow-up OB visit. Reviewed prenatal record including previous visit note.  Encounter for supervision of normal first pregnancy in third trimester Was observed in triage yesterday for SOB with a negative work up. Low FHR baseline with reactive NST. Today baby is in the 120s. Reviewed GBS swab for next visit. No allergies.  Reviewed kick counts and preterm labor warning signs. Instructed to call office or come to hospital with persistent headache, vision changes, regular contractions, leaking of fluid, decreased fetal movement or vaginal bleeding.       No orders of the defined types were placed in this encounter.  No follow-ups on file.   No future appointments.   For next visit:  ROB with GBS screening      Damien Parsley, CNM [facility] 10/30/258:32 AM

## 2024-09-02 ENCOUNTER — Encounter: Payer: Self-pay | Admitting: Obstetrics and Gynecology

## 2024-09-02 ENCOUNTER — Observation Stay
Admission: EM | Admit: 2024-09-02 | Discharge: 2024-09-02 | Disposition: A | Discharge status: Home / Self Care | Attending: Obstetrics and Gynecology | Admitting: Obstetrics and Gynecology

## 2024-09-02 ENCOUNTER — Observation Stay

## 2024-09-02 ENCOUNTER — Other Ambulatory Visit: Payer: Self-pay

## 2024-09-02 DIAGNOSIS — O99513 Diseases of the respiratory system complicating pregnancy, third trimester: Principal | ICD-10-CM | POA: Insufficient documentation

## 2024-09-02 DIAGNOSIS — Z3A34 34 weeks gestation of pregnancy: Secondary | ICD-10-CM | POA: Insufficient documentation

## 2024-09-02 DIAGNOSIS — O26893 Other specified pregnancy related conditions, third trimester: Secondary | ICD-10-CM

## 2024-09-02 DIAGNOSIS — R0602 Shortness of breath: Secondary | ICD-10-CM | POA: Insufficient documentation

## 2024-09-02 DIAGNOSIS — O368390 Maternal care for abnormalities of the fetal heart rate or rhythm, unspecified trimester, not applicable or unspecified: Secondary | ICD-10-CM | POA: Diagnosis not present

## 2024-09-02 DIAGNOSIS — O99013 Anemia complicating pregnancy, third trimester: Secondary | ICD-10-CM | POA: Diagnosis not present

## 2024-09-02 DIAGNOSIS — D649 Anemia, unspecified: Secondary | ICD-10-CM | POA: Diagnosis not present

## 2024-09-02 DIAGNOSIS — Z3490 Encounter for supervision of normal pregnancy, unspecified, unspecified trimester: Principal | ICD-10-CM

## 2024-09-02 DIAGNOSIS — O365930 Maternal care for other known or suspected poor fetal growth, third trimester, not applicable or unspecified: Secondary | ICD-10-CM | POA: Insufficient documentation

## 2024-09-02 DIAGNOSIS — O99019 Anemia complicating pregnancy, unspecified trimester: Secondary | ICD-10-CM | POA: Diagnosis present

## 2024-09-02 LAB — CBC WITH DIFFERENTIAL/PLATELET
Abs Immature Granulocytes: 0.2 K/uL — ABNORMAL HIGH (ref 0.00–0.07)
Basophils Absolute: 0 K/uL (ref 0.0–0.1)
Basophils Relative: 0 %
Eosinophils Absolute: 0.1 K/uL (ref 0.0–0.5)
Eosinophils Relative: 1 %
HCT: 28.4 % — ABNORMAL LOW (ref 36.0–46.0)
Hemoglobin: 9.9 g/dL — ABNORMAL LOW (ref 12.0–15.0)
Immature Granulocytes: 2 %
Lymphocytes Relative: 18 %
Lymphs Abs: 2.4 K/uL (ref 0.7–4.0)
MCH: 30.3 pg (ref 26.0–34.0)
MCHC: 34.9 g/dL (ref 30.0–36.0)
MCV: 86.9 fL (ref 80.0–100.0)
Monocytes Absolute: 1 K/uL (ref 0.1–1.0)
Monocytes Relative: 8 %
Neutro Abs: 9.5 K/uL — ABNORMAL HIGH (ref 1.7–7.7)
Neutrophils Relative %: 71 %
Platelets: 199 K/uL (ref 150–400)
RBC: 3.27 MIL/uL — ABNORMAL LOW (ref 3.87–5.11)
RDW: 12.1 % (ref 11.5–15.5)
WBC: 13.2 K/uL — ABNORMAL HIGH (ref 4.0–10.5)
nRBC: 0 % (ref 0.0–0.2)

## 2024-09-02 LAB — COMPREHENSIVE METABOLIC PANEL WITH GFR
ALT: 12 U/L (ref 0–44)
AST: 20 U/L (ref 15–41)
Albumin: 2.7 g/dL — ABNORMAL LOW (ref 3.5–5.0)
Alkaline Phosphatase: 93 U/L (ref 38–126)
Anion gap: 14 (ref 5–15)
BUN: 8 mg/dL (ref 6–20)
CO2: 18 mmol/L — ABNORMAL LOW (ref 22–32)
Calcium: 8.5 mg/dL — ABNORMAL LOW (ref 8.9–10.3)
Chloride: 105 mmol/L (ref 98–111)
Creatinine, Ser: 0.45 mg/dL (ref 0.44–1.00)
GFR, Estimated: >60 mL/min (ref >60)
Glucose, Bld: 148 mg/dL — ABNORMAL HIGH (ref 70–99)
Potassium: 3.4 mmol/L — ABNORMAL LOW (ref 3.5–5.1)
Sodium: 137 mmol/L (ref 135–145)
Total Bilirubin: 0.4 mg/dL (ref 0.0–1.2)
Total Protein: 6.5 g/dL (ref 6.5–8.1)

## 2024-09-02 LAB — URINALYSIS, ROUTINE W REFLEX MICROSCOPIC
Bilirubin Urine: NEGATIVE
Glucose, UA: NEGATIVE mg/dL
Hgb urine dipstick: NEGATIVE
Ketones, ur: NEGATIVE mg/dL
Leukocytes,Ua: NEGATIVE
Nitrite: NEGATIVE
Protein, ur: NEGATIVE mg/dL
Specific Gravity, Urine: 1.02 (ref 1.005–1.030)
pH: 7 (ref 5.0–8.0)

## 2024-09-02 LAB — BRAIN NATRIURETIC PEPTIDE: B Natriuretic Peptide: 47.2 pg/mL (ref 0.0–100.0)

## 2024-09-02 MED ORDER — ONDANSETRON 4 MG PO TBDP
4.0000 mg | ORAL_TABLET | Freq: Four times a day (QID) | ORAL | Status: DC | PRN
  Administered 2024-09-02: 4 mg via ORAL
  Filled 2024-09-02: qty 1

## 2024-09-02 NOTE — Progress Notes (Signed)
 Patient ID: Christina Butler, female   DOB: Jul 23, 2006, 18 y.o.   MRN: [id number]  Pt resting quietly, reports back pain is still present, continues to have the sensation as if she cannot catch her breath. Denies contractions.   Fetal tracing noted to have low baseline, at times 90's with moderate variability and accels present   Reviewed with Dr Leonce pt's symptoms,labs and fetal tracing.  Recommend chest x ray, consult cardio for echo cardiogram. Feta tracing overall reassuring despite low baseline.  OB US  in process C xray ordered Cardiology consult placed, [facility] cardiologist on call paged.   Jinnie Cookey, CNM  [facility] 09/02/24 5:39 AM

## 2024-09-02 NOTE — Consult Note (Signed)
 [facility] CLINIC CARDIOLOGY CONSULT NOTE       Patient ID: Christina Butler MRN: [id number] DOB/AGE: 25-Oct-2006 17 y.o.  Admit date: 09/02/2024 Referring Physician Jinnie Cookey, CNM Primary Physician Patient, No Pcp Per  Primary Cardiologist None Reason for Consultation SOB, concern for peripartum cardiomyopathy  HPI: Christina Butler is a 18 y.o. female  with a past medical history of anxiety/depression who presented to the ED on 09/02/2024 for shortness of breath, back pain. She is [redacted]w[redacted]d pregnant, concern for peripartum cardiomyopathy. Cardiology was consulted for further evaluation.   Patient reports SOB which she first noticed a few weeks ago. Over the last few days she has noticed this also at rest which was new, so she decided to come in for evaluation. Workup in the ED notable for creatinine 0.45, potassium 3.4, hemoglobin 9.9, WBC 13.2. BNP 47. EKG in the ED NSR rate 81 bpm. CXR without acute abnormality.   Patient seen and examined this AM, resting in bed comfortably. We discussed her symptoms in further detail. SOB began a few weeks ago and initially was only noticeable with activity. Over the last few days she has also noticed this at rest. Denies significant orthopnea. Denies CP, headache, swelling, syncope. Has chronic dizziness related to anemia. Endorses feeling her heart racing when she has Braxton-Hicks contractions. Otherwise no cardiac complaints.   Review of systems complete and found to be negative unless listed above    Past Medical History:  Diagnosis Date   Anxiety    Depression     Past Surgical History:  Procedure Laterality Date   TONSILLECTOMY      Medications Prior to Admission  Medication Sig Dispense Refill Last Dose/Taking   docusate sodium  (COLACE) 100 MG capsule Take 1 capsule (100 mg total) by mouth 2 (two) times daily as needed. 30 capsule 2 Past Month   ferrous sulfate 325 (65 FE) MG EC tablet Take 1 tablet (325 mg total) by mouth daily. 90 tablet  1 09/02/2024   ondansetron  (ZOFRAN ) 4 MG tablet TAKE 1 TABLET(4 MG) BY MOUTH EVERY 8 HOURS AS NEEDED FOR NAUSEA OR VOMITING 20 tablet 0 Past Month   Prenatal Vit-Fe Fumarate-FA (PRENATAL PO) Take by mouth.   09/02/2024   mupirocin ointment (BACTROBAN) 2 % Apply topically 2 (two) times daily. (Patient not taking: Reported on 08/18/2024)      Social History   Socioeconomic History   Marital status: Significant Other    Spouse name: Occupational Hygienist   Number of children: 0   Years of education: Not on file   Highest education level: Some college, no degree  Occupational History   Occupation: Biomedical Scientist, Conservation Officer, [employer]  Tobacco Use   Smoking status: Never    Passive exposure: Yes   Smokeless tobacco: Never   Tobacco comments:    father smokes outside and in the car with the windows down  Vaping Use   Vaping status: Former   Substances: Nicotine   Substance and Sexual Activity   Alcohol use: No   Drug use: Never   Sexual activity: Yes    Partners: Male    Birth control/protection: None    Comment: undecided  Other Topics Concern   Not on file  Social History Narrative   Not on file   Social Drivers of Health   Financial Resource Strain: Low Risk  (03/06/2024)   Overall Financial Resource Strain (CARDIA)    Difficulty of Paying Living Expenses: Not hard at all  Food Insecurity: No Food Insecurity (03/06/2024)   Hunger  Vital Sign    Worried About Programme Researcher, Broadcasting/film/video in the Last Year: Never true    Ran Out of Food in the Last Year: Never true  Transportation Needs: No Transportation Needs (03/06/2024)   PRAPARE - Administrator, Civil Service (Medical): No    Lack of Transportation (Non-Medical): No  Physical Activity: Sufficiently Active (03/06/2024)   Exercise Vital Sign    Days of Exercise per Week: 4 days    Minutes of Exercise per Session: 60 min  Stress: No Stress Concern Present (03/06/2024)   [employer] of Occupational Health - Occupational Stress Questionnaire    Feeling  of Stress : Only a little  Social Connections: Moderately Isolated (03/06/2024)   Social Connection and Isolation Panel    Frequency of Communication with Friends and Family: Three times a week    Frequency of Social Gatherings with Friends and Family: More than three times a week    Attends Religious Services: Never    Database Administrator or Organizations: No    Attends Engineer, Structural: Not on file    Marital Status: Living with partner  Intimate Partner Violence: Not At Risk (03/18/2024)   Received from [facility]   Humiliation, Afraid, Rape, and Kick questionnaire    Within the last year, have you been afraid of your partner or ex-partner?: No    Within the last year, have you been humiliated or emotionally abused in other ways by your partner or ex-partner?: No    Within the last year, have you been kicked, hit, slapped, or otherwise physically hurt by your partner or ex-partner?: No    Within the last year, have you been raped or forced to have any kind of sexual activity by your partner or ex-partner?: No    Family History  Problem Relation Age of Onset   Diabetes Mother    Healthy Father      Vitals:   09/02/24 0111  BP: 106/67  Pulse: 91  Resp: 20  Temp: 98.1 F (36.7 C)  TempSrc: Oral  Weight: 88.5 kg  Height: 5' 6 (1.676 m)    PHYSICAL EXAM General: Well appearing female, well nourished, in no acute distress. HEENT: Normocephalic and atraumatic. Neck: No JVD.  Lungs: Normal respiratory effort on room air. Clear bilaterally to auscultation. No wheezes, crackles, rhonchi.  Heart: HRRR. Normal S1 and S2 without gallops or murmurs.  Abdomen: Non-distended appearing.  Msk: Normal strength and tone for age. Extremities: Warm and well perfused. No clubbing, cyanosis. No edema.  Neuro: Alert and oriented X 3. Psych: Answers questions appropriately.   Labs: Basic Metabolic Panel: Recent Labs    09/02/24 0155  NA 137  K 3.4*  CL 105  CO2 18*   GLUCOSE 148*  BUN 8  CREATININE 0.45  CALCIUM 8.5*   Liver Function Tests: Recent Labs    09/02/24 0155  AST 20  ALT 12  ALKPHOS 93  BILITOT 0.4  PROT 6.5  ALBUMIN 2.7*   No results for input(s): LIPASE, AMYLASE in the last 72 hours. CBC: Recent Labs    09/02/24 0155  WBC 13.2*  NEUTROABS 9.5*  HGB 9.9*  HCT 28.4*  MCV 86.9  PLT 199   Cardiac Enzymes: No results for input(s): CKTOTAL, CKMB, CKMBINDEX, TROPONINIHS in the last 72 hours. BNP: Recent Labs    09/02/24 0149  BNP 47.2   D-Dimer: No results for input(s): DDIMER in the last 72 hours. Hemoglobin A1C:  No results for input(s): HGBA1C in the last 72 hours. Fasting Lipid Panel: No results for input(s): CHOL, HDL, LDLCALC, TRIG, CHOLHDL, LDLDIRECT in the last 72 hours. Thyroid Function Tests: No results for input(s): TSH, T4TOTAL, T3FREE, THYROIDAB in the last 72 hours.  Invalid input(s): FREET3 Anemia Panel: No results for input(s): VITAMINB12, FOLATE, FERRITIN, TIBC, IRON, RETICCTPCT in the last 72 hours.   Radiology: DG Chest 2 View Result Date: 09/02/2024 EXAM: 2 VIEW(S) XRAY OF THE CHEST 09/02/2024 05:45:25 AM COMPARISON: None available. CLINICAL HISTORY: Shortness of breath FINDINGS: LUNGS AND PLEURA: No focal pulmonary opacity. No pulmonary edema. No pleural effusion. No pneumothorax. HEART AND MEDIASTINUM: No acute abnormality of the cardiac and mediastinal silhouettes. BONES AND SOFT TISSUES: No acute osseous abnormality. IMPRESSION: 1. No acute cardiopulmonary abnormality. Electronically signed by: Norleen Kil MD 09/02/2024 06:01 AM EDT RP Workstation: [id number]   US  OB Limited Result Date: 09/02/2024 EXAM: ULTRASOUND OB LIMITED CLINICAL HISTORY: Antepartum fetal bradycardia affecting care of mother. TECHNIQUE: Transabdominal obstetric pelvic ultrasound was performed with color Doppler flow evaluation. COMPARISON: None provided. FINDINGS: FETUS: A  single live intrauterine pregnancy is present POSITION: The fetus is in a cephalic presentation. HEART RATE: Fetal heart rate measures 120 beats per minute. PLACENTA: The placenta is located posteriorly. No placental previa. AMNIOTIC FLUID: The amniotic fluid volume is within normal limits. Amniotic fluid index measures 13.7 cm. BIOMETRICS: BPD measures 9.1 cm. The cervix appears closed. IMPRESSION: 1. Single live intrauterine pregnancy in Cephalic presentation. 2.  Normal Fetal heart rate of 120 bpm. 3. Amniotic fluid volume within normal limits. Electronically signed by: Norleen Kil MD 09/02/2024 05:59 AM EDT RP Workstation: [id number]    ECHO ordered  TELEMETRY (personally reviewed): not on tele  EKG (personally reviewed): NSR rate 81 bpm  Data reviewed by me 09/02/2024: last 24h vitals tele labs imaging I/O ED provider note, admission H&P  Active Problems:   Indication for care in labor and delivery, antepartum    ASSESSMENT AND PLAN:  Christina Butler is a 18 y.o. female  with a past medical history of anxiety/depression who presented to the ED on 09/02/2024 for shortness of breath, back pain. She is [redacted]w[redacted]d pregnant, concern for peripartum cardiomyopathy. Cardiology was consulted for further evaluation.   # Shortness of breath # [redacted]w[redacted]d pregnant Patient with SOB worsening for a few weeks, now noticeable at rest. No HA, LE edema, or elevated BP. BNP normal at 47. CXR without acute abnormality. -Echo ordered. Further recommendations pending results.  -Further antepartum care as per OBGYN.  This patient's plan of care was discussed and created with Dr. Custovic and she is in agreement.  Signed: Danita Bloch, PA-C  09/02/2024, 7:11 AM [facility] Cardiology

## 2024-09-02 NOTE — Progress Notes (Signed)
 Discharge instructions provided to patient. Patient verbalized understanding. Pt educated on signs and symptoms of labor, vaginal bleeding, LOF, and fetal movement. Red flag signs reviewed by RN. Patient discharged home with significant other in stable condition.

## 2024-09-02 NOTE — OB Triage Note (Signed)
 Patient G1P0 34.3 weeks presents to L&D with complaints of back pain that started around 2300, pelvic pressure, and shortness of breath that started a few days ago. Rating back pain 5/10. Reports + fetal movement.   Jinnie, CNM notified of patient arrival.

## 2024-09-02 NOTE — OB Triage Provider Note (Signed)
 OB/Triage Note  Patient ID: Christina Butler MRN: [id number] DOB/AGE: May 30, 2006 18 y.o.  Subjective  History of Present Illness: The patient is a 18 y.o. female G1P0 at [redacted]w[redacted]d who presents for back pain, pelvic pressure and shortness of breath.   Christina Butler has had a sensation of shortness of breath like I can't breath for the last week. .  It feels like she cannot take a full breath, almost like a panic attack. She does feel pressure in her sternum that is different than the Reflux she has been experiencing. This sensation is worse when she is sitting up and improved when reclined back, it has not limited her activity this week. She does have a hx of anxiety/panic attacks, this does not feel like her usual anxiety symptoms. She has been feeling tired most of the time.   The back pain started about 1 to 2 hours ago, it started like a cramp feeling in her low back and now the pain radiates upwards, the pain comes and goes. Denies any injuries or accidents to the area.   Pelvic pressure has been there but has become more intense in the last 2 or 3 days.  She has been having braxton Hicks contractions, denies any regular and painful contractions. Denies LOF/VB. Reports she has been well hydrated. She has hx of anemia and is taking her iron supplements. She reports feeling nauseous and would like Zofran    Past Medical History:  Diagnosis Date   Anxiety    Depression     Past Surgical History:  Procedure Laterality Date   TONSILLECTOMY      No current facility-administered medications on file prior to encounter.   Current Outpatient Medications on File Prior to Encounter  Medication Sig Dispense Refill   docusate sodium  (COLACE) 100 MG capsule Take 1 capsule (100 mg total) by mouth 2 (two) times daily as needed. 30 capsule 2   ferrous sulfate 325 (65 FE) MG EC tablet Take 1 tablet (325 mg total) by mouth daily. 90 tablet 1   ondansetron  (ZOFRAN ) 4 MG tablet TAKE 1 TABLET(4 MG) BY MOUTH  EVERY 8 HOURS AS NEEDED FOR NAUSEA OR VOMITING 20 tablet 0   Prenatal Vit-Fe Fumarate-FA (PRENATAL PO) Take by mouth.     mupirocin ointment (BACTROBAN) 2 % Apply topically 2 (two) times daily. (Patient not taking: Reported on 08/18/2024)      Allergies  Allergen Reactions   Iodine Diarrhea and Nausea Only   Iodinated Contrast Media Nausea And Vomiting    Social History   Socioeconomic History   Marital status: Significant Other    Spouse name: Brycen   Number of children: 0   Years of education: Not on file   Highest education level: Some college, no degree  Occupational History   Occupation: Biomedical Scientist, Conservation Officer, [employer]  Tobacco Use   Smoking status: Never    Passive exposure: Yes   Smokeless tobacco: Never   Tobacco comments:    father smokes outside and in the car with the windows down  Vaping Use   Vaping status: Former   Substances: Nicotine   Substance and Sexual Activity   Alcohol use: No   Drug use: Never   Sexual activity: Yes    Partners: Male    Birth control/protection: None    Comment: undecided  Other Topics Concern   Not on file  Social History Narrative   Not on file   Social Drivers of Health   Financial Resource Strain: Low Risk  (03/06/2024)  Overall Financial Resource Strain (CARDIA)    Difficulty of Paying Living Expenses: Not hard at all  Food Insecurity: No Food Insecurity (03/06/2024)   Hunger Vital Sign    Worried About Running Out of Food in the Last Year: Never true    Ran Out of Food in the Last Year: Never true  Transportation Needs: No Transportation Needs (03/06/2024)   PRAPARE - Administrator, Civil Service (Medical): No    Lack of Transportation (Non-Medical): No  Physical Activity: Sufficiently Active (03/06/2024)   Exercise Vital Sign    Days of Exercise per Week: 4 days    Minutes of Exercise per Session: 60 min  Stress: No Stress Concern Present (03/06/2024)   [employer] of Occupational Health - Occupational Stress  Questionnaire    Feeling of Stress : Only a little  Social Connections: Moderately Isolated (03/06/2024)   Social Connection and Isolation Panel    Frequency of Communication with Friends and Family: Three times a week    Frequency of Social Gatherings with Friends and Family: More than three times a week    Attends Religious Services: Never    Database Administrator or Organizations: No    Attends Engineer, Structural: Not on file    Marital Status: Living with partner  Intimate Partner Violence: Not At Risk (03/18/2024)   Received from [facility]   Humiliation, Afraid, Rape, and Kick questionnaire    Within the last year, have you been afraid of your partner or ex-partner?: No    Within the last year, have you been humiliated or emotionally abused in other ways by your partner or ex-partner?: No    Within the last year, have you been kicked, hit, slapped, or otherwise physically hurt by your partner or ex-partner?: No    Within the last year, have you been raped or forced to have any kind of sexual activity by your partner or ex-partner?: No    Family History  Problem Relation Age of Onset   Diabetes Mother    Healthy Father      ROS see HPI    Objective  Physical Exam: BP 106/67 (BP Location: Left Arm)   Pulse 91   Temp 98.1 F (36.7 C) (Oral)   Resp 20   Ht 5' 6 (1.676 m)   Wt 88.5 kg   LMP 01/05/2024 (Exact Date)   BMI 31.49 kg/m   Physical Exam Constitutional:      Appearance: Normal appearance.  Cardiovascular:     Rate and Rhythm: Regular rhythm. Tachycardia present.     Pulses: Normal pulses.     Heart sounds: Normal heart sounds.  Pulmonary:     Effort: Pulmonary effort is normal.     Breath sounds: Normal breath sounds.  Abdominal:     Tenderness: There is no abdominal tenderness.     Comments: gravid  Musculoskeletal:     Cervical back: Normal range of motion.     Right lower leg: No edema.     Left lower leg: No edema.  Neurological:      General: No focal deficit present.     Mental Status: She is alert.  Skin:    General: Skin is warm.  Psychiatric:        Mood and Affect: Mood normal.        Thought Content: Thought content normal.   Dilation: 1 Effacement (%): Thick Station: -3   FHT baseline 120, moderate variability, pos  accel, neg decel  Toco q 1-2  Significant Findings/ Diagnostic Studies: labs pending    Hospital Course: The patient was admitted to [facility] Triage for observation.   Assessment: 18 y.o. female G1P0 at [redacted]w[redacted]d with shortness of breath and back pain.   Plan: CBC, Cmp, UA ordered  Zofran  for nausea Po hydrate   Jinnie Cookey, CNM  [facility] 09/02/24 5:04 AM

## 2024-09-02 NOTE — Discharge Summary (Signed)
 Physician Final Progress Note  Patient ID: Christina Butler MRN: [id number] DOB/AGE: 06/12/2006 17 y.o.  Admit date: 09/02/2024 Admitting provider: Garnette JONETTA Mace, MD Discharge date: 09/02/2024   Admission Diagnoses:  1) intrauterine pregnancy at [redacted]w[redacted]d  2) shortness of breath 3) back pain  Discharge Diagnoses:  Active Problems:   Antepartum anemia   Indication for care in labor and delivery, antepartum   Shortness of breath due to pregnancy in third trimester    History of Present Illness: The patient is a 18 y.o. female G1P0 at [redacted]w[redacted]d who presents for back pain that started late last night and shortness of breath that she has had for the past few days. She admits good fetal movement. Denies leakage of fluid or vaginal bleeding. The back pain feels like contractions that are happening frequently and not going away lasting 20-40 seconds. Irritability noted on NST. She is having trouble taking a deep breath. Denies s/s of anxiety. Patient symptoms were reviewed with Dr Mace. Given symptoms of shortness of breath and feeling of chest pressure, recommendation made to do chest x-ray, echocardiogram. Patient was admitted for observation and placed on monitors. NST reactive- baseline 110's. Chest x-ray normal. Echo pending. Patient reports feeling much better and is ready for discharge. Will follow up as needed after Echo results. Encouraged to increase hydration. She is discharged to home with instructions and precautions.   Past Medical History:  Diagnosis Date   Anxiety    Depression     Past Surgical History:  Procedure Laterality Date   TONSILLECTOMY      No current facility-administered medications on file prior to encounter.   Current Outpatient Medications on File Prior to Encounter  Medication Sig Dispense Refill   docusate sodium  (COLACE) 100 MG capsule Take 1 capsule (100 mg total) by mouth 2 (two) times daily as needed. 30 capsule 2   ferrous sulfate 325 (65 FE) MG EC  tablet Take 1 tablet (325 mg total) by mouth daily. 90 tablet 1   ondansetron  (ZOFRAN ) 4 MG tablet TAKE 1 TABLET(4 MG) BY MOUTH EVERY 8 HOURS AS NEEDED FOR NAUSEA OR VOMITING 20 tablet 0   Prenatal Vit-Fe Fumarate-FA (PRENATAL PO) Take by mouth.     mupirocin ointment (BACTROBAN) 2 % Apply topically 2 (two) times daily. (Patient not taking: Reported on 08/18/2024)      Allergies  Allergen Reactions   Iodine Diarrhea and Nausea Only   Iodinated Contrast Media Nausea And Vomiting    Social History   Socioeconomic History   Marital status: Significant Other    Spouse name: Brycen   Number of children: 0   Years of education: Not on file   Highest education level: Some college, no degree  Occupational History   Occupation: Biomedical Scientist, Conservation Officer, [employer]  Tobacco Use   Smoking status: Never    Passive exposure: Yes   Smokeless tobacco: Never   Tobacco comments:    father smokes outside and in the car with the windows down  Vaping Use   Vaping status: Former   Substances: Nicotine   Substance and Sexual Activity   Alcohol use: No   Drug use: Never   Sexual activity: Yes    Partners: Male    Birth control/protection: None    Comment: undecided  Other Topics Concern   Not on file  Social History Narrative   Not on file   Social Drivers of Health   Financial Resource Strain: Low Risk  (03/06/2024)   Overall Financial Resource Strain (CARDIA)  Difficulty of Paying Living Expenses: Not hard at all  Food Insecurity: No Food Insecurity (03/06/2024)   Hunger Vital Sign    Worried About Running Out of Food in the Last Year: Never true    Ran Out of Food in the Last Year: Never true  Transportation Needs: No Transportation Needs (03/06/2024)   PRAPARE - Administrator, Civil Service (Medical): No    Lack of Transportation (Non-Medical): No  Physical Activity: Sufficiently Active (03/06/2024)   Exercise Vital Sign    Days of Exercise per Week: 4 days    Minutes of Exercise per  Session: 60 min  Stress: No Stress Concern Present (03/06/2024)   [employer] of Occupational Health - Occupational Stress Questionnaire    Feeling of Stress : Only a little  Social Connections: Moderately Isolated (03/06/2024)   Social Connection and Isolation Panel    Frequency of Communication with Friends and Family: Three times a week    Frequency of Social Gatherings with Friends and Family: More than three times a week    Attends Religious Services: Never    Database Administrator or Organizations: No    Attends Engineer, Structural: Not on file    Marital Status: Living with partner  Intimate Partner Violence: Not At Risk (03/18/2024)   Received from [facility]   Humiliation, Afraid, Rape, and Kick questionnaire    Within the last year, have you been afraid of your partner or ex-partner?: No    Within the last year, have you been humiliated or emotionally abused in other ways by your partner or ex-partner?: No    Within the last year, have you been kicked, hit, slapped, or otherwise physically hurt by your partner or ex-partner?: No    Within the last year, have you been raped or forced to have any kind of sexual activity by your partner or ex-partner?: No    Family History  Problem Relation Age of Onset   Diabetes Mother    Healthy Father      Review of Systems  Constitutional:  Negative for chills and fever.  HENT:  Negative for congestion, ear discharge, ear pain, hearing loss, sinus pain and sore throat.   Eyes:  Negative for blurred vision and double vision.  Respiratory:  Positive for shortness of breath. Negative for cough and wheezing.   Cardiovascular:  Positive for chest pain. Negative for palpitations and leg swelling.  Gastrointestinal:  Negative for abdominal pain, blood in stool, constipation, diarrhea, heartburn, melena, nausea and vomiting.  Genitourinary:  Negative for dysuria, flank pain, frequency, hematuria and urgency.  Musculoskeletal:   Positive for back pain. Negative for joint pain and myalgias.  Skin:  Negative for itching and rash.  Neurological:  Negative for dizziness, tingling, tremors, sensory change, speech change, focal weakness, seizures, loss of consciousness, weakness and headaches.  Endo/Heme/Allergies:  Negative for environmental allergies. Does not bruise/bleed easily.  Psychiatric/Behavioral:  Negative for depression, hallucinations, memory loss, substance abuse and suicidal ideas. The patient is not nervous/anxious and does not have insomnia.      Physical Exam: BP 126/68 (BP Location: Left Arm)   Pulse 94   Temp 98.6 F (37 C) (Oral)   Resp 18   Ht 5' 6 (1.676 m)   Wt 88.5 kg   LMP 01/05/2024 (Exact Date)   BMI 31.49 kg/m   Constitutional: Well nourished, well developed female in no acute distress.  HEENT: normal Skin: Warm and dry.  Cardiovascular:  Regular rate and rhythm.   Extremity: no edema Respiratory:  Normal respiratory effort Abdomen: FHT present Psych: Alert and Oriented x3. No memory deficits. Normal mood and affect.   Riah Kehoe Jan 03, 2006 [redacted]w[redacted]d  Fetus A Non-Stress Test Interpretation for 09/02/24 Reactive  Indication: triage  Fetal Heart Rate A Mode: External Baseline Rate (A): 115 bpm (fht) Variability: Moderate Accelerations: 15 x 15 Decelerations: None  Uterine Activity Mode: Toco Contraction Frequency (min): UI Contraction Duration (sec): 40-100 Contraction Quality: Mild Resting Tone Palpated: Relaxed Resting Time: Adequate    Consults: cardiology  Significant Findings/ Diagnostic Studies:   Latest Reference Range & Units 09/02/24 01:49 09/02/24 01:55 09/02/24 02:20 09/02/24 05:26 09/02/24 05:45  Comprehensive metabolic panel with GFR   Rpt !     Sodium 135 - 145 mmol/L  137     Potassium 3.5 - 5.1 mmol/L  3.4 (L)     Chloride 98 - 111 mmol/L  105     CO2 22 - 32 mmol/L  18 (L)     Glucose 70 - 99 mg/dL  851 (H)     BUN 6 - 20 mg/dL  8      Creatinine 9.55 - 1.00 mg/dL  9.54     Calcium 8.9 - 10.3 mg/dL  8.5 (L)     Anion gap 5 - 15   14     Alkaline Phosphatase 38 - 126 U/L  93     Albumin 3.5 - 5.0 g/dL  2.7 (L)     AST 15 - 41 U/L  20     ALT 0 - 44 U/L  12     Total Protein 6.5 - 8.1 g/dL  6.5     Total Bilirubin 0.0 - 1.2 mg/dL  0.4     GFR, Estimated >60 mL/min  >60     B Natriuretic Peptide 0.0 - 100.0 pg/mL 47.2      WBC 4.0 - 10.5 K/uL  13.2 (H)     RBC 3.87 - 5.11 MIL/uL  3.27 (L)     Hemoglobin 12.0 - 15.0 g/dL  9.9 (L)     HCT 63.9 - 46.0 %  28.4 (L)     MCV 80.0 - 100.0 fL  86.9     MCH 26.0 - 34.0 pg  30.3     MCHC 30.0 - 36.0 g/dL  65.0     RDW 88.4 - 84.4 %  12.1     Platelets 150 - 400 K/uL  199     nRBC 0.0 - 0.2 %  0.0     Neutrophils %  71     Lymphocytes %  18     Monocytes Relative %  8     Eosinophil %  1     Basophil %  0     Immature Granulocytes %  2     NEUT# 1.7 - 7.7 K/uL  9.5 (H)     Lymphs Abs 0.7 - 4.0 K/uL  2.4     Monocyte # 0.1 - 1.0 K/uL  1.0     Eosinophils Absolute 0.0 - 0.5 K/uL  0.1     Basophils Absolute 0.0 - 0.1 K/uL  0.0     Abs Immature Granulocytes 0.00 - 0.07 K/uL  0.20 (H)     Urinalysis, Routine w reflex microscopic   Rpt !     Appearance CLEAR   CLOUDY !     Bilirubin Urine NEGATIVE   NEGATIVE  Color, Urine YELLOW   YELLOW !     Glucose, UA NEGATIVE mg/dL  NEGATIVE     Hgb urine dipstick NEGATIVE   NEGATIVE     Ketones, ur NEGATIVE mg/dL  NEGATIVE     Leukocytes,Ua NEGATIVE   NEGATIVE     Nitrite NEGATIVE   NEGATIVE     pH 5.0 - 8.0   7.0     Protein NEGATIVE mg/dL  NEGATIVE     Specific Gravity, Urine 1.005 - 1.030   1.020     DG Chest 2 View      Rpt  US  OB Limited     Rpt   EKG 12-Lead    Rpt    !: Data is abnormal (L): Data is abnormally low (H): Data is abnormally high Rpt: View report in Results Review for more information  Procedures: chest x-ray, echocardiogram, fetal ultrasound, EKG  Hospital Course: The patient was admitted to Labor  and Delivery Triage for observation.   Discharge Condition: good  Disposition: Discharge disposition: 01-Home or Self Care  Diet: Regular diet  Discharge Activity: Activity as tolerated  Discharge Instructions     Discharge activity:  No Restrictions   Complete by: As directed    Discharge diet:  No restrictions   Complete by: As directed       Allergies as of 09/02/2024       Reactions   Iodine Diarrhea, Nausea Only   Iodinated Contrast Media Nausea And Vomiting        Medication List     STOP taking these medications    mupirocin ointment 2 % Commonly known as: BACTROBAN       TAKE these medications    docusate sodium  100 MG capsule Commonly known as: COLACE Take 1 capsule (100 mg total) by mouth 2 (two) times daily as needed.   ferrous sulfate 325 (65 FE) MG EC tablet Take 1 tablet (325 mg total) by mouth daily.   ondansetron  4 MG tablet Commonly known as: ZOFRAN  TAKE 1 TABLET(4 MG) BY MOUTH EVERY 8 HOURS AS NEEDED FOR NAUSEA OR VOMITING   PRENATAL PO Take by mouth.        Follow-up Information     Custovic, Annalee, DO. Go in 1 week(s).   Specialty: Cardiology Contact information: [address] [place] [address] [phone]         [facility] [place]. Go to.   Specialty: Obstetrics and Gynecology Why: scheduled prenatal appointment Contact information: [address] [place]  [address] [phone]                Total time spent taking care of this patient: 35 minutes  Signed: Slater Rains, CNM  09/02/2024, 10:08 AM

## 2024-09-03 ENCOUNTER — Ambulatory Visit (INDEPENDENT_AMBULATORY_CARE_PROVIDER_SITE_OTHER): Admitting: Certified Nurse Midwife

## 2024-09-03 VITALS — BP 107/61 | HR 116 | Wt 202.6 lb

## 2024-09-03 DIAGNOSIS — Z3403 Encounter for supervision of normal first pregnancy, third trimester: Secondary | ICD-10-CM

## 2024-09-03 NOTE — Assessment & Plan Note (Signed)
 Was observed in triage yesterday for SOB with a negative work up. Low FHR baseline with reactive NST. Today baby is in the 120s. Reviewed GBS swab for next visit. No allergies.  Reviewed kick counts and preterm labor warning signs. Instructed to call office or come to hospital with persistent headache, vision changes, regular contractions, leaking of fluid, decreased fetal movement or vaginal bleeding.

## 2024-09-17 ENCOUNTER — Other Ambulatory Visit (HOSPITAL_COMMUNITY)
Admission: RE | Admit: 2024-09-17 | Discharge: 2024-09-17 | Disposition: A | Discharge status: Home / Self Care | Source: Ambulatory Visit | Attending: Certified Nurse Midwife | Admitting: Certified Nurse Midwife

## 2024-09-17 ENCOUNTER — Ambulatory Visit: Admitting: Certified Nurse Midwife

## 2024-09-17 VITALS — BP 110/60 | HR 101 | Wt 204.0 lb

## 2024-09-17 DIAGNOSIS — Z3685 Encounter for antenatal screening for Streptococcus B: Secondary | ICD-10-CM

## 2024-09-17 DIAGNOSIS — Z3403 Encounter for supervision of normal first pregnancy, third trimester: Secondary | ICD-10-CM

## 2024-09-17 DIAGNOSIS — Z3A36 36 weeks gestation of pregnancy: Secondary | ICD-10-CM

## 2024-09-17 DIAGNOSIS — Z113 Encounter for screening for infections with a predominantly sexual mode of transmission: Secondary | ICD-10-CM

## 2024-09-17 NOTE — Patient Instructions (Signed)
 Signs and Symptoms of Labor Labor is the body's natural process of moving the baby and the placenta out of the uterus. The process of labor usually starts when the baby is full-term, between 26 and 41 weeks of pregnancy. Signs and symptoms that you are close to going into labor As your body prepares for labor and the birth of your baby, you may notice the following symptoms in the weeks and days before true labor starts: Passing a small amount of thick, bloody mucus from your vagina. This is called normal bloody show or losing your mucus plug. This may happen more than a week before labor begins, or right before labor begins, as the opening of the cervix starts to widen (dilate). For some women, the entire mucus plug passes at once. For others, pieces of the mucus plug may gradually pass over several days. Your baby moving (dropping) lower in your pelvis to get into position for birth (lightening). When this happens, you may feel more pressure on your bladder and pelvic bone and less pressure on your ribs. This may make it easier to breathe. It may also cause you to need to urinate more often and have problems with bowel movements. Having practice contractions, also called Braxton Hicks contractions or false labor. These occur at irregular (unevenly spaced) intervals that are more than 10 minutes apart. False labor contractions are common after exercise or sexual activity. They will stop if you change position, rest, or drink fluids. These contractions are usually mild and do not get stronger over time. They may feel like: A backache or back pain. Mild cramps, similar to menstrual cramps. Tightening or pressure in your abdomen. Other early symptoms include: Nausea or loss of appetite. Diarrhea. Having a sudden burst of energy, or feeling very tired. Mood changes. Having trouble sleeping. Signs and symptoms that labor has begun Signs that you are in labor may include: Having contractions that come  at regular (evenly spaced) intervals and increase in intensity. This may feel like more intense tightening or pressure in your abdomen that moves to your back. Contractions may also feel like rhythmic pain in your upper thighs or back that comes and goes at regular intervals. If you are delivering for the first time, this change in intensity of contractions often occurs at a more gradual pace. If you have given birth before, you may notice a more rapid progression of contraction changes. Feeling pressure in the vaginal area. Your water breaking (rupture of membranes). This is when the sac of fluid that surrounds your baby breaks. Fluid leaking from your vagina may be clear or blood-tinged. Labor usually starts within 24 hours of your water breaking, but it may take longer to begin. Some people may feel a sudden gush of fluid; others may notice repeatedly damp underwear. Follow these instructions at home:  When labor starts, or if your water breaks, call your health care provider or nurse care line. Based on your situation, they will determine when you should go in for an exam. During early labor, you may be able to rest and manage symptoms at home. Some strategies to try at home include: Breathing and relaxation techniques. Taking a warm bath or shower. Listening to music. Using a heating pad on the lower back for pain. If directed, apply heat to the area as often as told by your health care provider. Use the heat source that your health care provider recommends, such as a moist heat pack or a heating pad. Place a  towel between your skin and the heat source. Leave the heat on for 20-30 minutes. Remove the heat if your skin turns bright red. This is especially important if you are unable to feel pain, heat, or cold. You have a greater risk of getting burned. Contact a health care provider if: Your labor has started. Your water breaks. You have nausea, vomiting, or diarrhea. Get help right away  if: You have painful, regular contractions that are 5 minutes apart or less. Labor starts before you are [redacted] weeks along in your pregnancy. You have a fever. You have bright red blood coming from your vagina. You do not feel your baby moving. You have a severe headache with or without vision problems. You have chest pain or shortness of breath. These symptoms may represent a serious problem that is an emergency. Do not wait to see if the symptoms will go away. Get medical help right away. Call your local emergency services (911 in the [place] Do not drive yourself to the hospital. Summary Labor is your body's natural process of moving your baby and the placenta out of your uterus. The process of labor usually starts when your baby is full-term, between 25 and 40 weeks of pregnancy. When labor starts, or if your water breaks, call your health care provider or nurse care line. Based on your situation, they will determine when you should go in for an exam. This information is not intended to replace advice given to you by your health care provider. Make sure you discuss any questions you have with your health care provider. Document Revised: 03/07/2021 Document Reviewed: 03/07/2021 Elsevier Patient Education  2024 [employer]. Third Trimester of Pregnancy  The third trimester of pregnancy is from week 28 through week 40. This is months 7 through 9. The third trimester is a time when your baby is growing fast. Body changes during your third trimester Your body continues to change during this time. The changes usually go away after your baby is born. Physical changes You will continue to gain weight. You may get stretch marks on your hips, belly, and breasts. Your breasts will keep growing and may hurt. A yellow fluid (colostrum) may leak from your breasts. This is the first milk you're making for your baby. Your hair may grow faster and get thicker. In some cases, you may get hair loss. Your  belly button may stick out. You may have more swelling in your hands, face, or ankles. Health changes You may have heartburn. You may feel short of breath. This is caused by the uterus that is now bigger. You may have more aches in the pelvis, back, or thighs. You may have more tingling or numbness in your hands, arms, and legs. You may pee more often. You may have trouble pooping (constipation) or swollen veins in the butt that can itch or get painful (hemorrhoids). Other changes You may have more problems sleeping. You may notice the baby moving lower in your belly (dropping). You may have more fluid coming from your vagina. Your joints may feel loose, and you may have pain around your pelvic bone. Follow these instructions at home: Medicines Take medicines only as told by your health care provider. Some medicines are not safe during pregnancy. Your provider may change the medicines that you take. Do not take any medicines unless told to by your provider. Take a prenatal vitamin that has at least 600 micrograms (mcg) of folic acid. Do not use herbal medicines, illegal drugs,  or medicines that are not approved by your provider. Eating and drinking While you're pregnant your body needs additional nutrition to help support your growing baby. Talk with your provider about your nutritional needs. Activity Most women are able to exercise regularly during pregnancy. Exercise routines may need to change at the end of your pregnancy. Talk to your provider about your activities and exercise routine. Relieving pain and discomfort Rest often with your legs raised if you have leg cramps or low back pain. Take warm sitz baths to soothe pain from hemorrhoids. Use hemorrhoid cream if your provider says it's okay. Wear a good, supportive bra if your breasts hurt. Do not use hot tubs, steam rooms, or saunas. Do not douche. Do not use tampons or scented pads. Safety Talk to your provider before  traveling far distances. Wear your seatbelt at all times when you're in a car. Talk to your provider if someone hits you, hurts you, or yells at you. Preparing for birth To prepare for your baby: Take childbirth and breastfeeding classes. Visit the hospital and tour the maternity area. Buy a rear-facing car seat. Learn how to install it in your car. General instructions Avoid cat litter boxes and soil used by cats. These things carry germs that can cause harm to your pregnancy and your baby. Do not drink alcohol, smoke, vape, or use products with nicotine or tobacco in them. If you need help quitting, talk with your provider. Keep all follow-up visits for your third trimester. Your provider will do more exams and tests during this trimester. Write down your questions. Take them to your prenatal visits. Your provider also will: Talk with you about your overall health. Give you advice or refer you to specialists who can help with different needs, including: Mental health and counseling. Foods and healthy eating. Ask for help if you need help with food. Where to find more information American Pregnancy Association: americanpregnancy.org [employer] of Obstetricians and Gynecologists: acog.org Office on [employer] Health: [employer] Contact a health care provider if: You have a headache that does not go away when you take medicine. You have any of these problems: You can't eat or drink. You have nausea and vomiting. You have watery poop (diarrhea) for 2 days or more. You have pain when you pee, or your pee smells bad. You have been sick for 2 days or more and aren't getting better. Contact your provider right away if: You have any of these coming from your vagina: Abnormal discharge. Bad-smelling fluid. Bleeding. Your baby is moving less than usual. You have signs of labor: You have any contractions, belly cramping, or have pain in your pelvis or lower back before 37 weeks of  pregnancy (preterm labor). You have regular contractions that are less than 5 minutes apart. Your water breaks. You have symptoms of high blood pressure or preeclampsia. These include: A severe, throbbing headache that does not go away. Sudden or extreme swelling of your face, hands, legs, or feet. Vision problems: You see spots. You have blurry vision. Your eyes are sensitive to light. If you can't reach your provider, go to an urgent care or emergency room. Get help right away if: You faint, become confused, or can't think clearly. You have chest pain or trouble breathing. You have any kind of injury, such as from a fall or a car crash. These symptoms may be an emergency. Call 911 right away. Do not wait to see if the symptoms will go away. Do not drive yourself  to the hospital. This information is not intended to replace advice given to you by your health care provider. Make sure you discuss any questions you have with your health care provider. Document Revised: 07/25/2023 Document Reviewed: 02/22/2023 Elsevier Patient Education  2024 [employer].

## 2024-09-17 NOTE — Progress Notes (Signed)
 ROB doing well, feeling good movement. Leopold's suggest vertex position. Discussed labor precautions. She verbalizes understanding. She has had sinus headache .Discussed over the counter medication treatment. Pt self swab for GBS. Follow up 1 wk.  Zelda Hummer, CNM

## 2024-09-19 ENCOUNTER — Other Ambulatory Visit: Payer: Self-pay | Admitting: Advanced Practice Midwife

## 2024-09-19 ENCOUNTER — Observation Stay
Admission: EM | Admit: 2024-09-19 | Discharge: 2024-09-20 | Disposition: A | Discharge status: Home / Self Care | Attending: Advanced Practice Midwife | Admitting: Advanced Practice Midwife

## 2024-09-19 DIAGNOSIS — R102 Pelvic and perineal pain unspecified side: Secondary | ICD-10-CM

## 2024-09-19 DIAGNOSIS — Z3A37 37 weeks gestation of pregnancy: Secondary | ICD-10-CM | POA: Insufficient documentation

## 2024-09-19 DIAGNOSIS — Z3A36 36 weeks gestation of pregnancy: Secondary | ICD-10-CM

## 2024-09-19 DIAGNOSIS — N949 Unspecified condition associated with female genital organs and menstrual cycle: Secondary | ICD-10-CM | POA: Insufficient documentation

## 2024-09-19 DIAGNOSIS — O26893 Other specified pregnancy related conditions, third trimester: Principal | ICD-10-CM

## 2024-09-19 LAB — STREP GP B NAA: Strep Gp B NAA: NEGATIVE

## 2024-09-19 NOTE — Progress Notes (Signed)
 Referral for chiropractic sent- symphysis pubis pain

## 2024-09-19 NOTE — Discharge Instructions (Signed)
 podexchange.nl  https://www.[facility].com/pregnancy-chiropractic/#:~:text=Pregnancy%20Chiropractic%20[place]%20[place]%20%7C%20(336)%20584%2D9932  Avoid triggers as much as you can. Sit down to get dressed, and avoid heavy lifting and pushing  Apply a heating pad or ice pack to the pubic bone. If you use a heating pad, don't leave it on for more than 10 minutes at a time, since longer can can raise your baby's temperature. (You can safely cycle the pad on and off every 10 minutes.) Wear a pelvic support belt. They're readily available online and corset the pelvic bones back into place during pregnancy. Do your Kegels and pelvic tilts. Regular practice helps strengthen the muscles of the pelvis. Consider physical therapy or chiropractic

## 2024-09-19 NOTE — OB Triage Note (Signed)
 Pt Christina Butler 18 y.o. presents to labor and delivery triage reporting  increased pelvic pressure. Reported pain to CNM at [facility] on Thursday has had  no relief, now having trouble standing and walking without assistance. . Pt is a G1P0 at [redacted]w[redacted]d . Pt denies signs and symptons consistent with rupture of membranes or active vaginal bleeding. Pt denies contractions and states positive fetal movement. External FM and TOCO applied to non-tender abdomen and assessing. Initial FHR 125 . Vital signs obtained and within normal limits. Provider notified of pt.

## 2024-09-20 NOTE — Discharge Summary (Signed)
 Physician Final Progress Note  Patient ID: Christina Butler MRN: [id number] DOB/AGE: 04/24/2006 18 y.o.  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 female G1P0 at [redacted]w[redacted]d who presents for pain in her pelvis for the past week. The pain is sharp and worse with movement. She feels it mid front pelvis and in her hips and back. She has tried using pillows in bed and soaking in the tub without any relief. Her partner has to help her from sitting to standing. She rates the pain as severe. 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 Comfort measures reviewed including ice or heat, hip support band, chiropractic or physical therapy. She would like a referral for chiropractic.  She admits good fetal movement and denies contractions, leakage of fluid, vaginal bleeding. NST is reassuring while in observation. She is discharged to home with instructions and precautions.   Past Medical History:  Diagnosis Date   Anxiety    Depression     Past Surgical History:  Procedure Laterality Date   TONSILLECTOMY      No current facility-administered medications on file prior to encounter.   Current Outpatient Medications on File Prior to Encounter  Medication Sig Dispense Refill   ferrous sulfate 325 (65 FE) MG EC tablet Take 1 tablet (325 mg total) by mouth daily. 90 tablet 1   Prenatal Vit-Fe Fumarate-FA (PRENATAL PO) Take by mouth.     docusate sodium  (COLACE) 100 MG capsule Take 1 capsule (100 mg total) by mouth 2 (two) times daily as needed. 30 capsule 2   ondansetron  (ZOFRAN ) 4 MG tablet TAKE 1 TABLET(4 MG) BY MOUTH  EVERY 8 HOURS AS NEEDED FOR NAUSEA OR VOMITING 20 tablet 0    Allergies  Allergen Reactions   Iodine Diarrhea and Nausea Only   Iodinated Contrast Media Nausea And Vomiting    Social History   Socioeconomic History   Marital status: Significant Other    Spouse name: Brycen   Number of children: 0   Years of education: Not on file   Highest education level: Some college, no degree  Occupational History   Occupation: Biomedical Scientist, Conservation Officer, [employer]  Tobacco Use   Smoking status: Never    Passive exposure: Yes   Smokeless tobacco: Never   Tobacco comments:    father smokes outside and in the car with the windows down  Vaping Use   Vaping status: Former   Substances: Nicotine   Substance and Sexual Activity   Alcohol use: No   Drug use: Never   Sexual activity: Yes    Partners: Male    Birth control/protection: None    Comment: undecided  Other Topics Concern   Not on file  Social History Narrative   Not on file   Social Drivers of Health   Financial Resource Strain: Low Risk  (03/06/2024)   Overall Financial Resource Strain (CARDIA)    Difficulty of Paying Living Expenses: Not hard at all  Food Insecurity: No Food Insecurity (03/06/2024)   Hunger Vital Sign    Worried About Running Out of Food in the Last Year: Never true    Ran Out of Food in  the Last Year: Never true  Transportation Needs: No Transportation Needs (03/06/2024)   PRAPARE - Administrator, Civil Service (Medical): No    Lack of Transportation (Non-Medical): No  Physical Activity: Sufficiently Active (03/06/2024)   Exercise Vital Sign    Days of Exercise per Week: 4 days    Minutes of Exercise per Session: 60 min  Stress: No Stress Concern Present (03/06/2024)   [employer] of Occupational Health - Occupational Stress Questionnaire    Feeling of Stress : Only a little  Social Connections: Moderately Isolated (03/06/2024)   Social Connection and Isolation Panel    Frequency of Communication with  Friends and Family: Three times a week    Frequency of Social Gatherings with Friends and Family: More than three times a week    Attends Religious Services: Never    Database Administrator or Organizations: No    Attends Engineer, Structural: Not on file    Marital Status: Living with partner  Intimate Partner Violence: Not At Risk (03/18/2024)   [facility] from [facility]   Humiliation, Afraid, Rape, and Kick questionnaire    Within the last year, have you been afraid of your partner or ex-partner?: No    Within the last year, have you been humiliated or emotionally abused in other ways by your partner or ex-partner?: No    Within the last year, have you been kicked, hit, slapped, or otherwise physically hurt by your partner or ex-partner?: No    Within the last year, have you been raped or forced to have any kind of sexual activity by your partner or ex-partner?: No    Family History  Problem Relation Age of Onset   Diabetes Mother    Healthy Father      Review of Systems  Constitutional:  Negative for chills and fever.  HENT:  Negative for congestion, ear discharge, ear pain, hearing loss, sinus pain and sore throat.   Eyes:  Negative for blurred vision and double vision.  Respiratory:  Negative for cough, shortness of breath and wheezing.   Cardiovascular:  Negative for chest pain, palpitations and leg swelling.  Gastrointestinal:  Negative for abdominal pain, blood in stool, constipation, diarrhea, heartburn, melena, nausea and vomiting.  Genitourinary:  Negative for dysuria, flank pain, frequency, hematuria and urgency.       Positive for pelvic pain  Musculoskeletal:  Negative for back pain, joint pain and myalgias.  Skin:  Negative for itching and rash.  Neurological:  Negative for dizziness, tingling, tremors, sensory change, speech change, focal weakness, seizures, loss of consciousness, weakness and headaches.  Endo/Heme/Allergies:  Negative for environmental  allergies. Does not bruise/bleed easily.  Psychiatric/Behavioral:  Negative for depression, hallucinations, memory loss, substance abuse and suicidal ideas. The patient is not nervous/anxious and does not have insomnia.      Physical Exam: BP 120/65   Pulse 97   Temp 98.4 F (36.9 C) (Oral)   LMP 01/05/2024 (Exact Date)   Constitutional: Well nourished, well developed female in no acute distress. Grimace with movement.  HEENT: normal Skin: Warm and dry.  Cardiovascular: Regular rate and rhythm.   Extremity: no edema  Respiratory:  Normal respiratory effort Abdomen: FHT present Psych: Alert and Oriented x3. No memory deficits. Normal mood and affect.   Toco: uterine irritability Fetal well being: 125 bpm, moderate variability, +accelerations, -decelerations  Consults: None  Significant Findings/ Diagnostic Studies: none  Procedures: NST  Hospital Course: The patient was  admitted to Labor and Delivery Triage for observation.   Discharge Condition: good  Disposition: Discharge disposition: 01-Home or Self Care  Diet: Regular diet  Discharge Activity: Activity as tolerated  Discharge Instructions     Discharge activity:  No Restrictions   Complete by: As directed    Activity as tolerated   Discharge diet:  No restrictions   Complete by: As directed       Allergies as of 09/20/2024       Reactions   Iodine Diarrhea, Nausea Only   Iodinated Contrast Media Nausea And Vomiting        Medication List     TAKE these medications    docusate sodium  100 MG capsule Commonly known as: COLACE Take 1 capsule (100 mg total) by mouth 2 (two) times daily as needed.   ferrous sulfate 325 (65 FE) MG EC tablet Take 1 tablet (325 mg total) by mouth daily.   ondansetron  4 MG tablet Commonly known as: ZOFRAN  TAKE 1 TABLET(4 MG) BY MOUTH EVERY 8 HOURS AS NEEDED FOR NAUSEA OR VOMITING   PRENATAL PO Take by mouth.        Follow-up Information     [facility]. Go to.   Specialty: Obstetrics and Gynecology Why: scheduled prenatal appointment Contact information: [address] [place]  [phone]              Referral sent for chiropractic: [facility]  Total time spent taking care of this patient: 24 minutes  Signed: Slater Rains, CNM  09/20/2024, 12:01 AM

## 2024-09-20 NOTE — Discharge Summary (Incomplete)
 Physician Final Progress Note  Patient ID: Christina Butler MRN: [id number] DOB/AGE: 26-Dec-2005 18 y.o.  Admit date: 09/19/2024 Admitting provider: Slater Rains, CNM Discharge date: 09/20/2024   Admission Diagnoses:  1) intrauterine pregnancy at [redacted]w[redacted]d  2) ***  Discharge Diagnoses:  Principal Problem:   Pelvic pain affecting pregnancy in third trimester, antepartum Active Problems:   Pain of female symphysis pubis   [redacted] weeks gestation of pregnancy  ***  History of Present Illness: The patient is a 18 y.o. female G1P0 at [redacted]w[redacted]d who presents for ***.   Past Medical History:  Diagnosis Date  . Anxiety   . Depression     Past Surgical History:  Procedure Laterality Date  . TONSILLECTOMY      No current facility-administered medications on file prior to encounter.   Current Outpatient Medications on File Prior to Encounter  Medication Sig Dispense Refill  . ferrous sulfate 325 (65 FE) MG EC tablet Take 1 tablet (325 mg total) by mouth daily. 90 tablet 1  . Prenatal Vit-Fe Fumarate-FA (PRENATAL PO) Take by mouth.    . docusate sodium  (COLACE) 100 MG capsule Take 1 capsule (100 mg total) by mouth 2 (two) times daily as needed. 30 capsule 2  . ondansetron  (ZOFRAN ) 4 MG tablet TAKE 1 TABLET(4 MG) BY MOUTH EVERY 8 HOURS AS NEEDED FOR NAUSEA OR VOMITING 20 tablet 0    Allergies  Allergen Reactions  . Iodine Diarrhea and Nausea Only  . Iodinated Contrast Media Nausea And Vomiting    Social History   Socioeconomic History  . Marital status: Significant Other    Spouse name: Brycen  . Number of children: 0  . Years of education: Not on file  . Highest education level: Some college, no degree  Occupational History  . Occupation: Biomedical Scientist, Conservation Officer, [employer]  Tobacco Use  . Smoking status: Never    Passive exposure: Yes  . Smokeless tobacco: Never  . Tobacco comments:    father smokes outside and in the car with the windows down  Vaping Use  . Vaping status: Former  .  Substances: Nicotine   Substance and Sexual Activity  . Alcohol use: No  . Drug use: Never  . Sexual activity: Yes    Partners: Male    Birth control/protection: None    Comment: undecided  Other Topics Concern  . Not on file  Social History Narrative  . Not on file   Social Drivers of Health   Financial Resource Strain: Low Risk  (03/06/2024)   Overall Financial Resource Strain (CARDIA)   . Difficulty of Paying Living Expenses: Not hard at all  Food Insecurity: No Food Insecurity (03/06/2024)   Hunger Vital Sign   . Worried About Programme Researcher, Broadcasting/film/video in the Last Year: Never true   . Ran Out of Food in the Last Year: Never true  Transportation Needs: No Transportation Needs (03/06/2024)   PRAPARE - Transportation   . Lack of Transportation (Medical): No   . Lack of Transportation (Non-Medical): No  Physical Activity: Sufficiently Active (03/06/2024)   Exercise Vital Sign   . Days of Exercise per Week: 4 days   . Minutes of Exercise per Session: 60 min  Stress: No Stress Concern Present (03/06/2024)   [employer] of Occupational Health - Occupational Stress Questionnaire   . Feeling of Stress : Only a little  Social Connections: Moderately Isolated (03/06/2024)   Social Connection and Isolation Panel   . Frequency of Communication with Friends and Family: Three times  a week   . Frequency of Social Gatherings with Friends and Family: More than three times a week   . Attends Religious Services: Never   . Active Member of Clubs or Organizations: No   . Attends Banker Meetings: Not on file   . Marital Status: Living with partner  Intimate Partner Violence: Not At Risk (03/18/2024)   Received from [facility]   Humiliation, Afraid, Rape, and Kick questionnaire   . Within the last year, have you been afraid of your partner or ex-partner?: No   . Within the last year, have you been humiliated or emotionally abused in other ways by your partner or ex-partner?: No   .  Within the last year, have you been kicked, hit, slapped, or otherwise physically hurt by your partner or ex-partner?: No   . Within the last year, have you been raped or forced to have any kind of sexual activity by your partner or ex-partner?: No    Family History  Problem Relation Age of Onset  . Diabetes Mother   . Healthy Father      ROS   Physical Exam: BP 120/65   Pulse 97   Temp 98.4 F (36.9 C) (Oral)   LMP 01/05/2024 (Exact Date)   OBGyn Exam  Consults: {consultation:18241}  Significant Findings/ Diagnostic Studies: {diagnostics:18242}  Procedures: ***  Hospital Course: The patient was admitted to Labor and Delivery Triage for observation. ***  Discharge Condition: {condition:18240}  Disposition: Discharge disposition: 01-Home or Self Care       Diet: {CHL DISCHARGE DIET:21201}  Discharge Activity: {CHL DISCHARGE ACTIVITY ORDER:21200}  Discharge Instructions     Discharge activity:  No Restrictions   Complete by: As directed    Activity as tolerated   Discharge diet:  No restrictions   Complete by: As directed       Allergies as of 09/20/2024       Reactions   Iodine Diarrhea, Nausea Only   Iodinated Contrast Media Nausea And Vomiting        Medication List     TAKE these medications    docusate sodium  100 MG capsule Commonly known as: COLACE Take 1 capsule (100 mg total) by mouth 2 (two) times daily as needed.   ferrous sulfate 325 (65 FE) MG EC tablet Take 1 tablet (325 mg total) by mouth daily.   ondansetron  4 MG tablet Commonly known as: ZOFRAN  TAKE 1 TABLET(4 MG) BY MOUTH EVERY 8 HOURS AS NEEDED FOR NAUSEA OR VOMITING   PRENATAL PO Take by mouth.        Follow-up Information      [facility]. Go to.   Specialty: Obstetrics and Gynecology Why: scheduled prenatal appointment Contact information: [address] [place]  [address] [phone]                 Total time spent taking care of this patient: *** minutes  Signed: Slater Rains, CNM  09/20/2024, 12:01 AM

## 2024-09-21 ENCOUNTER — Encounter: Payer: Self-pay | Admitting: Certified Nurse Midwife

## 2024-09-21 LAB — CERVICOVAGINAL ANCILLARY ONLY
Chlamydia: NEGATIVE
Comment: NEGATIVE
Comment: NORMAL
Neisseria Gonorrhea: NEGATIVE

## 2024-09-23 NOTE — Patient Instructions (Incomplete)
 Signs and Symptoms of Labor Labor is the body's natural process of moving the baby and the placenta out of the uterus. The process of labor usually starts when the baby is full-term, between 28 and 41 weeks of pregnancy. Signs and symptoms that you are close to going into labor As your body prepares for labor and the birth of your baby, you may notice the following symptoms in the weeks and days before true labor starts: Passing a small amount of thick, bloody mucus from your vagina. This is called normal bloody show or losing your mucus plug. This may happen more than a week before labor begins, or right before labor begins, as the opening of the cervix starts to widen (dilate). For some women, the entire mucus plug passes at once. For others, pieces of the mucus plug may gradually pass over several days. Your baby moving (dropping) lower in your pelvis to get into position for birth (lightening). When this happens, you may feel more pressure on your bladder and pelvic bone and less pressure on your ribs. This may make it easier to breathe. It may also cause you to need to urinate more often and have problems with bowel movements. Having practice contractions, also called Braxton Hicks contractions or false labor. These occur at irregular (unevenly spaced) intervals that are more than 10 minutes apart. False labor contractions are common after exercise or sexual activity. They will stop if you change position, rest, or drink fluids. These contractions are usually mild and do not get stronger over time. They may feel like: A backache or back pain. Mild cramps, similar to menstrual cramps. Tightening or pressure in your abdomen. Other early symptoms include: Nausea or loss of appetite. Diarrhea. Having a sudden burst of energy, or feeling very tired. Mood changes. Having trouble sleeping. Signs and symptoms that labor has begun Signs that you are in labor may include: Having contractions that come  at regular (evenly spaced) intervals and increase in intensity. This may feel like more intense tightening or pressure in your abdomen that moves to your back. Contractions may also feel like rhythmic pain in your upper thighs or back that comes and goes at regular intervals. If you are delivering for the first time, this change in intensity of contractions often occurs at a more gradual pace. If you have given birth before, you may notice a more rapid progression of contraction changes. Feeling pressure in the vaginal area. Your water breaking (rupture of membranes). This is when the sac of fluid that surrounds your baby breaks. Fluid leaking from your vagina may be clear or blood-tinged. Labor usually starts within 24 hours of your water breaking, but it may take longer to begin. Some people may feel a sudden gush of fluid; others may notice repeatedly damp underwear. Follow these instructions at home:  When labor starts, or if your water breaks, call your health care provider or nurse care line. Based on your situation, they will determine when you should go in for an exam. During early labor, you may be able to rest and manage symptoms at home. Some strategies to try at home include: Breathing and relaxation techniques. Taking a warm bath or shower. Listening to music. Using a heating pad on the lower back for pain. If directed, apply heat to the area as often as told by your health care provider. Use the heat source that your health care provider recommends, such as a moist heat pack or a heating pad. Place a  towel between your skin and the heat source. Leave the heat on for 20-30 minutes. Remove the heat if your skin turns bright red. This is especially important if you are unable to feel pain, heat, or cold. You have a greater risk of getting burned. Contact a health care provider if: Your labor has started. Your water breaks. You have nausea, vomiting, or diarrhea. Get help right away  if: You have painful, regular contractions that are 5 minutes apart or less. Labor starts before you are [redacted] weeks along in your pregnancy. You have a fever. You have bright red blood coming from your vagina. You do not feel your baby moving. You have a severe headache with or without vision problems. You have chest pain or shortness of breath. These symptoms may represent a serious problem that is an emergency. Do not wait to see if the symptoms will go away. Get medical help right away. Call your local emergency services (911 in the [place] Do not drive yourself to the hospital. Summary Labor is your body's natural process of moving your baby and the placenta out of your uterus. The process of labor usually starts when your baby is full-term, between 35 and 40 weeks of pregnancy. When labor starts, or if your water breaks, call your health care provider or nurse care line. Based on your situation, they will determine when you should go in for an exam. This information is not intended to replace advice given to you by your health care provider. Make sure you discuss any questions you have with your health care provider. Document Revised: 03/07/2021 Document Reviewed: 03/07/2021 Elsevier Patient Education  2024 [employer].  Third Trimester of Pregnancy  The third trimester of pregnancy is from week 28 through week 40. This is months 7 through 9. The third trimester is a time when your baby is growing fast. Body changes during your third trimester Your body continues to change during this time. The changes usually go away after your baby is born. Physical changes You will continue to gain weight. You may get stretch marks on your hips, belly, and breasts. Your breasts will keep growing and may hurt. A yellow fluid (colostrum) may leak from your breasts. This is the first milk you're making for your baby. Your hair may grow faster and get thicker. In some cases, you may get hair loss. Your  belly button may stick out. You may have more swelling in your hands, face, or ankles. Health changes You may have heartburn. You may feel short of breath. This is caused by the uterus that is now bigger. You may have more aches in the pelvis, back, or thighs. You may have more tingling or numbness in your hands, arms, and legs. You may pee more often. You may have trouble pooping (constipation) or swollen veins in the butt that can itch or get painful (hemorrhoids). Other changes You may have more problems sleeping. You may notice the baby moving lower in your belly (dropping). You may have more fluid coming from your vagina. Your joints may feel loose, and you may have pain around your pelvic bone. Follow these instructions at home: Medicines Take medicines only as told by your health care provider. Some medicines are not safe during pregnancy. Your provider may change the medicines that you take. Do not take any medicines unless told to by your provider. Take a prenatal vitamin that has at least 600 micrograms (mcg) of folic acid. Do not use herbal medicines, illegal  drugs, or medicines that are not approved by your provider. Eating and drinking While you're pregnant your body needs additional nutrition to help support your growing baby. Talk with your provider about your nutritional needs. Activity Most women are able to exercise regularly during pregnancy. Exercise routines may need to change at the end of your pregnancy. Talk to your provider about your activities and exercise routine. Relieving pain and discomfort Rest often with your legs raised if you have leg cramps or low back pain. Take warm sitz baths to soothe pain from hemorrhoids. Use hemorrhoid cream if your provider says it's okay. Wear a good, supportive bra if your breasts hurt. Do not use hot tubs, steam rooms, or saunas. Do not douche. Do not use tampons or scented pads. Safety Talk to your provider before  traveling far distances. Wear your seatbelt at all times when you're in a car. Talk to your provider if someone hits you, hurts you, or yells at you. Preparing for birth To prepare for your baby: Take childbirth and breastfeeding classes. Visit the hospital and tour the maternity area. Buy a rear-facing car seat. Learn how to install it in your car. General instructions Avoid cat litter boxes and soil used by cats. These things carry germs that can cause harm to your pregnancy and your baby. Do not drink alcohol, smoke, vape, or use products with nicotine  or tobacco in them. If you need help quitting, talk with your provider. Keep all follow-up visits for your third trimester. Your provider will do more exams and tests during this trimester. Write down your questions. Take them to your prenatal visits. Your provider also will: Talk with you about your overall health. Give you advice or refer you to specialists who can help with different needs, including: Mental health and counseling. Foods and healthy eating. Ask for help if you need help with food. Where to find more information American Pregnancy Association: americanpregnancy.org [employer] of Obstetricians and Gynecologists: acog.org Office on [employer] Health: [place] Contact a health care provider if: You have a headache that does not go away when you take medicine. You have any of these problems: You can't eat or drink. You have nausea and vomiting. You have watery poop (diarrhea) for 2 days or more. You have pain when you pee, or your pee smells bad. You have been sick for 2 days or more and aren't getting better. Contact your provider right away if: You have any of these coming from your vagina: Abnormal discharge. Bad-smelling fluid. Bleeding. Your baby is moving less than usual. You have signs of labor: You have any contractions, belly cramping, or have pain in your pelvis or lower back before 37 weeks of  pregnancy (preterm labor). You have regular contractions that are less than 5 minutes apart. Your water breaks. You have symptoms of high blood pressure or preeclampsia. These include: A severe, throbbing headache that does not go away. Sudden or extreme swelling of your face, hands, legs, or feet. Vision problems: You see spots. You have blurry vision. Your eyes are sensitive to light. If you can't reach your provider, go to an urgent care or emergency room. Get help right away if: You faint, become confused, or can't think clearly. You have chest pain or trouble breathing. You have any kind of injury, such as from a fall or a car crash. These symptoms may be an emergency. Call 911 right away. Do not wait to see if the symptoms will go away. Do not drive  yourself to the hospital. This information is not intended to replace advice given to you by your health care provider. Make sure you discuss any questions you have with your health care provider. Document Revised: 07/25/2023 Document Reviewed: 02/22/2023 Elsevier Patient Education  2024 [employer].

## 2024-09-23 NOTE — Progress Notes (Unsigned)
    Return Prenatal Note   Subjective   18 y.o. G1P0 at [redacted]w[redacted]d presents for this follow-up prenatal visit.  Patient reports continued pelvic pain, paying attention to positions & has gotten an exercise ball which has helped. Patient reports: Movement: Present Contractions: Irregular  Objective   Flow sheet Vitals: Pulse Rate: (!) 109 BP: 115/70 Fundal Height: 37 cm Fetal Heart Rate (bpm): 130 Presentation: Vertex Total weight gain: 41 lb 11.2 oz (18.9 kg)  General Appearance  No acute distress, well appearing, and well nourished Pulmonary   Normal work of breathing Neurologic   Alert and oriented to person, place, and time Psychiatric   Mood and affect within normal limits   Assessment/Plan   Plan  18 y.o. G1P0 at [redacted]w[redacted]d presents for follow-up OB visit. Reviewed prenatal record including previous visit note.  Encounter for supervision of normal first pregnancy in third trimester Reviewed labor warning signs and expectations for birth. Instructed to call office or come to hospital with persistent headache, vision changes, regular contractions, leaking of fluid, decreased fetal movement or vaginal bleeding.  Pelvic pain affecting pregnancy in third trimester, antepartum Comfort measures reviewed, encouraged avoidance of asymmetric movements, consider SI belt.      No orders of the defined types were placed in this encounter.  Return in 1 week (on 10/01/2024) for ROB.   Future Appointments  Date Time Provider Department Center  09/30/2024  1:55 PM Starla Harland BROCKS, MD [facility] None     For next visit:  continue with routine prenatal care     Harlene LITTIE Cisco, CNM  11/20/258:41 AM

## 2024-09-24 ENCOUNTER — Encounter: Payer: Self-pay | Admitting: Certified Nurse Midwife

## 2024-09-24 ENCOUNTER — Ambulatory Visit (INDEPENDENT_AMBULATORY_CARE_PROVIDER_SITE_OTHER): Admitting: Certified Nurse Midwife

## 2024-09-24 VITALS — BP 115/70 | HR 109 | Wt 206.7 lb

## 2024-09-24 DIAGNOSIS — O26893 Other specified pregnancy related conditions, third trimester: Secondary | ICD-10-CM

## 2024-09-24 DIAGNOSIS — Z3A37 37 weeks gestation of pregnancy: Secondary | ICD-10-CM | POA: Diagnosis not present

## 2024-09-24 DIAGNOSIS — R102 Pelvic and perineal pain unspecified side: Secondary | ICD-10-CM

## 2024-09-24 DIAGNOSIS — Z3403 Encounter for supervision of normal first pregnancy, third trimester: Secondary | ICD-10-CM

## 2024-09-24 NOTE — Assessment & Plan Note (Signed)
 Reviewed labor warning signs and expectations for birth. Instructed to call office or come to hospital with persistent headache, vision changes, regular contractions, leaking of fluid, decreased fetal movement or vaginal bleeding.

## 2024-09-24 NOTE — Assessment & Plan Note (Signed)
 Comfort measures reviewed, encouraged avoidance of asymmetric movements, consider SI belt.

## 2024-09-25 ENCOUNTER — Other Ambulatory Visit: Payer: Self-pay

## 2024-09-25 ENCOUNTER — Inpatient Hospital Stay: Admission: EM | Admit: 2024-09-25 | Discharge: 2024-09-26 | Disposition: A | Discharge status: Home / Self Care

## 2024-09-25 DIAGNOSIS — O471 False labor at or after 37 completed weeks of gestation: Secondary | ICD-10-CM | POA: Insufficient documentation

## 2024-09-25 DIAGNOSIS — Z3A37 37 weeks gestation of pregnancy: Secondary | ICD-10-CM | POA: Insufficient documentation

## 2024-09-25 DIAGNOSIS — O26893 Other specified pregnancy related conditions, third trimester: Secondary | ICD-10-CM

## 2024-09-25 DIAGNOSIS — Z3490 Encounter for supervision of normal pregnancy, unspecified, unspecified trimester: Secondary | ICD-10-CM

## 2024-09-25 DIAGNOSIS — Z2839 Other underimmunization status: Secondary | ICD-10-CM

## 2024-09-25 DIAGNOSIS — N949 Unspecified condition associated with female genital organs and menstrual cycle: Secondary | ICD-10-CM

## 2024-09-25 NOTE — OB Triage Note (Signed)
 Patient is a G1P0, [redacted]w[redacted]d that comes in with complaints of contractions every 7-8 minutes since 1900 today. Patient denies any LOF or bleeding. +FM. VSS. Patient rates contraction pain a 5 on a 0-10 pain scale, stating that she can talk through them. Monitors applied and assessing.

## 2024-09-26 DIAGNOSIS — O26893 Other specified pregnancy related conditions, third trimester: Secondary | ICD-10-CM

## 2024-09-26 DIAGNOSIS — Z3A37 37 weeks gestation of pregnancy: Secondary | ICD-10-CM

## 2024-09-26 DIAGNOSIS — R109 Unspecified abdominal pain: Secondary | ICD-10-CM | POA: Diagnosis not present

## 2024-09-26 DIAGNOSIS — O471 False labor at or after 37 completed weeks of gestation: Secondary | ICD-10-CM | POA: Diagnosis not present

## 2024-09-26 NOTE — OB Triage Provider Note (Signed)
 LABOR & DELIVERY OB TRIAGE NOTE  SUBJECTIVE  HPI Christina Butler is a 18 y.o. G1P0 at [redacted]w[redacted]d who presents to Labor & Delivery for labor evaluation. She has been very uncomfortable at the end of her pregnancy. She has been having occasional contractions. She had intercourse tonight, and then the contractions picked up. Feeling over it. Denies LOF or vaginal bleeding. Reports good fetal movement.   OB History     Gravida  1   Para      Term      Preterm      AB      Living         SAB      IAB      Ectopic      Multiple      Live Births              OBJECTIVE BP 131/69 (BP Location: Right Arm)   Pulse (!) 108   Temp 97.8 F (36.6 C) (Oral)   Resp 16   Ht 5' 6 (1.676 m)   Wt 93.4 kg   LMP 01/05/2024 (Exact Date)   SpO2 99%   BMI 33.25 kg/m   General: Conversational, NAD Abdomen: Gravid, soft, NT Cervical exam: Dilation: 1.5 Effacement (%): 50 Cervical Position: Middle Station: -3 Exam by:: Heather Goins,RN   NST Baseline: 120 Variability: moderate Accelerations: Present Decelerations: Absent Toco: +Irritability Category 1  ASSESSMENT/ PLAN 1) Pregnancy at G1P0, [redacted]w[redacted]d, Estimated Date of Delivery: 10/11/24. Not in labor. Contractions have spaced out over time. Feels she'd be able to sleep through them. States she has benadryl  and tylenol at home. Feels safe going home and returning if contractions intensify. Discharge order placed. 2) Reassuring maternal/fetal status   Lauraine Lakes, [facility] 09/26/24  12:49 AM

## 2024-09-30 ENCOUNTER — Ambulatory Visit (INDEPENDENT_AMBULATORY_CARE_PROVIDER_SITE_OTHER): Admitting: Obstetrics & Gynecology

## 2024-09-30 ENCOUNTER — Other Ambulatory Visit: Payer: Self-pay | Admitting: Certified Nurse Midwife

## 2024-09-30 VITALS — BP 105/69 | HR 116 | Wt 208.0 lb

## 2024-09-30 DIAGNOSIS — O219 Vomiting of pregnancy, unspecified: Secondary | ICD-10-CM

## 2024-09-30 DIAGNOSIS — Z3A38 38 weeks gestation of pregnancy: Secondary | ICD-10-CM

## 2024-09-30 DIAGNOSIS — D649 Anemia, unspecified: Secondary | ICD-10-CM

## 2024-09-30 DIAGNOSIS — O99013 Anemia complicating pregnancy, third trimester: Secondary | ICD-10-CM | POA: Diagnosis not present

## 2024-09-30 DIAGNOSIS — Z3403 Encounter for supervision of normal first pregnancy, third trimester: Secondary | ICD-10-CM

## 2024-09-30 NOTE — Progress Notes (Signed)
   PRENATAL VISIT NOTE  Subjective:  Christina Butler is a 18 y.o. G1P0 at [redacted]w[redacted]d being seen today for ongoing prenatal care.  She is currently monitored for the following issues for this low-risk pregnancy and has Anxiety; Depression; Bipolar disorder (HCC); Gastroenteritis; Dyslipidemia; Otalgia of both ears; Encounter for supervision of normal first pregnancy in third trimester; Maternal varicella, non-immune; Fainting spell; Anemia during pregnancy in third trimester; Shortness of breath due to pregnancy in third trimester; Pelvic pain affecting pregnancy in third trimester, antepartum; and Pain of female symphysis pubis on their problem list.  Patient reports no complaints.  Contractions: Irritability. Vag. Bleeding: None.  Movement: Present. Denies leaking of fluid.   The following portions of the patient's history were reviewed and updated as appropriate: allergies, current medications, past family history, past medical history, past social history, past surgical history and problem list.   Objective:   Vitals:   09/30/24 1359  BP: 105/69  Pulse: (!) 116  Weight: 208 lb (94.3 kg)    Fetal Status:  Fetal Heart Rate (bpm): 140 Fundal Height: 38 cm Movement: Present Presentation: Vertex  General: Alert, oriented and cooperative. Patient is in no acute distress.  Skin: Skin is warm and dry. No rash noted.   Cardiovascular: Normal heart rate noted  Respiratory: Normal respiratory effort, no problems with respiration noted  Abdomen: Soft, gravid, appropriate for gestational age.  Pain/Pressure: Present     Pelvic: Cervical exam performed in the presence of a chaperone Dilation: 3.5 Effacement (%): 90 Station: -2  Extremities: Normal range of motion.     Mental Status: Normal mood and affect. Normal behavior. Normal judgment and thought content.      07/24/2022   10:01 AM  Depression screen PHQ 2/9  Decreased Interest 0  Down, Depressed, Hopeless 0  PHQ - 2 Score 0         No data  to display          Assessment and Plan:  Pregnancy: G1P0 at [redacted]w[redacted]d 1. [redacted] weeks gestation of pregnancy (Primary)   2. Encounter for supervision of normal first pregnancy in third trimester   3. Anemia during pregnancy in third trimester - oral iron  Term labor symptoms and general obstetric precautions including but not limited to vaginal bleeding, contractions, leaking of fluid and fetal movement were reviewed in detail with the patient. Please refer to After Visit Summary for other counseling recommendations.   Return in about 1 week (around 10/07/2024) for ROB.  No future appointments.  Harland JAYSON Birkenhead, MD

## 2024-10-01 ENCOUNTER — Other Ambulatory Visit: Payer: Self-pay

## 2024-10-01 ENCOUNTER — Observation Stay
Admission: EM | Admit: 2024-10-01 | Discharge: 2024-10-01 | Disposition: A | Discharge status: Home / Self Care | Attending: Certified Nurse Midwife | Admitting: Certified Nurse Midwife

## 2024-10-01 ENCOUNTER — Encounter: Payer: Self-pay | Admitting: Obstetrics and Gynecology

## 2024-10-01 DIAGNOSIS — R109 Unspecified abdominal pain: Secondary | ICD-10-CM

## 2024-10-01 DIAGNOSIS — Z3A38 38 weeks gestation of pregnancy: Secondary | ICD-10-CM | POA: Insufficient documentation

## 2024-10-01 DIAGNOSIS — O368330 Maternal care for abnormalities of the fetal heart rate or rhythm, third trimester, not applicable or unspecified: Secondary | ICD-10-CM | POA: Insufficient documentation

## 2024-10-01 DIAGNOSIS — O471 False labor at or after 37 completed weeks of gestation: Principal | ICD-10-CM | POA: Insufficient documentation

## 2024-10-01 DIAGNOSIS — N949 Unspecified condition associated with female genital organs and menstrual cycle: Secondary | ICD-10-CM

## 2024-10-01 DIAGNOSIS — Z2839 Other underimmunization status: Principal | ICD-10-CM

## 2024-10-01 DIAGNOSIS — O26893 Other specified pregnancy related conditions, third trimester: Secondary | ICD-10-CM

## 2024-10-01 MED ORDER — ZOLPIDEM TARTRATE 5 MG PO TABS
10.0000 mg | ORAL_TABLET | Freq: Once | ORAL | Status: AC
  Administered 2024-10-01: 10 mg via ORAL
  Filled 2024-10-01: qty 2

## 2024-10-01 NOTE — Progress Notes (Signed)
 Discharge instructions provided to patient. Patient verbalized understanding. Pt educated on signs and symptoms of labor, vaginal bleeding, LOF, and fetal movement. Red flag signs reviewed by RN. Patient discharged home with significant other in stable condition.

## 2024-10-01 NOTE — OB Triage Provider Note (Signed)
      L&D OB Triage Note  SUBJECTIVE Christina Butler is a 18 y.o. G1P0 female at [redacted]w[redacted]d, EDD Estimated Date of Delivery: 10/11/24 who presented to triage with complaints of contractions.  She denies loss of fluid, vaginal bleeding,  and is feeling good movement.   OB History  Gravida Para Term Preterm AB Living  1 0 0 0 0 0  SAB IAB Ectopic Multiple Live Births  0 0 0 0 0    # Outcome Date GA Lbr Len/2nd Weight Sex Type Anes PTL Lv  1 Current             Medications Prior to Admission  Medication Sig Dispense Refill Last Dose/Taking   docusate sodium  (COLACE) 100 MG capsule Take 1 capsule (100 mg total) by mouth 2 (two) times daily as needed. 30 capsule 2 Past Month   ferrous sulfate  325 (65 FE) MG EC tablet Take 1 tablet (325 mg total) by mouth daily. 90 tablet 1 10/01/2024   ondansetron  (ZOFRAN ) 4 MG tablet TAKE 1 TABLET(4 MG) BY MOUTH EVERY 8 HOURS AS NEEDED FOR NAUSEA OR VOMITING 20 tablet 0 Past Week   Prenatal Vit-Fe Fumarate-FA (PRENATAL PO) Take by mouth.   10/01/2024     OBJECTIVE  Nursing Evaluation:   BP 119/72 (BP Location: Right Arm)   Pulse (!) 114   Temp 98.2 F (36.8 C) (Oral)   Resp 18   LMP 01/05/2024 (Exact Date)    Findings:   irregular contractions, false labor      NST was performed and has been reviewed by me.  NST INTERPRETATION: Category I  Mode: External Baseline Rate (A): 115 bpm Variability: Moderate Accelerations: 15 x 15 Decelerations: None     Contraction Frequency (min): x2 noted w/ UI  ASSESSMENT Impression:  1.  Pregnancy:  G1P0 at [redacted]w[redacted]d , EDD Estimated Date of Delivery: 10/11/24 2.  Reassuring fetal and maternal status 3.  No cervical change  PLAN 1. Current condition and above findings reviewed.  Reassuring fetal and maternal condition. 2. Discharge home with standard labor precautions given to return to L&D or call the office for problems. 3. Continue routine prenatal care.  Zelda Hummer, CNM

## 2024-10-01 NOTE — OB Triage Note (Addendum)
 Patient is a 18 yo, G1P0, at 38 weeks and 4 days. Patient presents with complaints of contractions. Pt reports that her contractions have been happening all day but progressively got worse in the past 2 hours. She reports she has been contracting every 2 minutes. Patient denies any vaginal bleeding or LOF. Patient reports +FM. Monitors applied and assessing. Initial fetal heart tone is 125. A Thompson CNM notified of patients arrival to unit. Plan to do SVE and labor eval

## 2024-10-01 NOTE — Discharge Instructions (Signed)
 Take a warm bath/shower, prop your feet up, heated pad/blanket, stay hydrated, massages

## 2024-10-06 ENCOUNTER — Observation Stay
Admission: EM | Admit: 2024-10-06 | Discharge: 2024-10-06 | Disposition: A | Discharge status: Home / Self Care | Attending: Certified Nurse Midwife | Admitting: Obstetrics and Gynecology

## 2024-10-06 ENCOUNTER — Encounter: Admitting: Obstetrics & Gynecology

## 2024-10-06 ENCOUNTER — Other Ambulatory Visit: Payer: Self-pay

## 2024-10-06 ENCOUNTER — Encounter: Payer: Self-pay | Admitting: Obstetrics and Gynecology

## 2024-10-06 ENCOUNTER — Encounter: Payer: Self-pay | Admitting: Obstetrics & Gynecology

## 2024-10-06 DIAGNOSIS — N898 Other specified noninflammatory disorders of vagina: Secondary | ICD-10-CM

## 2024-10-06 DIAGNOSIS — O26893 Other specified pregnancy related conditions, third trimester: Secondary | ICD-10-CM | POA: Insufficient documentation

## 2024-10-06 DIAGNOSIS — Z2839 Other underimmunization status: Principal | ICD-10-CM

## 2024-10-06 DIAGNOSIS — M545 Low back pain, unspecified: Secondary | ICD-10-CM | POA: Diagnosis not present

## 2024-10-06 DIAGNOSIS — Z3A39 39 weeks gestation of pregnancy: Secondary | ICD-10-CM | POA: Insufficient documentation

## 2024-10-06 DIAGNOSIS — N949 Unspecified condition associated with female genital organs and menstrual cycle: Secondary | ICD-10-CM

## 2024-10-06 DIAGNOSIS — O368130 Decreased fetal movements, third trimester, not applicable or unspecified: Secondary | ICD-10-CM | POA: Diagnosis not present

## 2024-10-06 LAB — WET PREP, GENITAL
Clue Cells Wet Prep HPF POC: NONE SEEN
Sperm: NONE SEEN
Trich, Wet Prep: NONE SEEN
WBC, Wet Prep HPF POC: <10 (ref <10)
Yeast Wet Prep HPF POC: NONE SEEN

## 2024-10-06 LAB — RUPTURE OF MEMBRANE (ROM)PLUS: Rom Plus: NEGATIVE

## 2024-10-06 NOTE — OB Triage Note (Signed)
 Pt reports to labor and delivery with complaints of LOF. States that she was at [employer] and felt some fluid drip down her leg. States that she went to the bathroom and she didn't pee herself. Denies vaginal bleeding. States positive fetal movement. States that contractions have started since leaving [employer].   States mild back pain.  EFM and toco applied and assessing.

## 2024-10-06 NOTE — OB Triage Note (Signed)
 LABOR & DELIVERY OB TRIAGE NOTE  SUBJECTIVE  HPI Christina Butler is a 18 y.o. G1P0 at [redacted]w[redacted]d who presents to Labor & Delivery for evaluation of rupture of membranes. She felt some fluid earlier while at [facility]. She does not feel as though it was urine. Denies vaginal itching or burning. Endorse fetal movement.  OB History     Gravida  1   Para      Term      Preterm      AB      Living         SAB      IAB      Ectopic      Multiple      Live Births               OBJECTIVE  Pulse (!) 105   Temp 98.1 F (36.7 C) (Oral)   Resp 18   Ht 5' 6 (1.676 m)   Wt 94.3 kg   LMP 01/05/2024 (Exact Date)   BMI 33.57 kg/m   General: A&O x 3 Lungs: normal work of breathing Abdomen: soft, non- tender Cervical exam:   deferred  NST I reviewed the NST and it was reactive.  Baseline: 120 Variability: moderate Accelerations: 15 x 15 Decelerations:variable Toco: occasional  Category 1  ASSESSMENT Impression  1) Pregnancy at G1P0, [redacted]w[redacted]d, Estimated Date of Delivery: 10/11/24 2) Reassuring maternal/fetal status 3)ROM+ is negative, wet prep negative.  PLAN 1)Discharge home in stable condition. Return precautions given.   Damien PARSLEY, CNM  10/06/24  6:16 PM

## 2024-10-06 NOTE — OB Triage Note (Signed)

## 2024-10-11 ENCOUNTER — Observation Stay
Admission: EM | Admit: 2024-10-11 | Discharge: 2024-10-11 | Disposition: A | Discharge status: Home / Self Care | Attending: Licensed Practical Nurse | Admitting: Licensed Practical Nurse

## 2024-10-11 ENCOUNTER — Inpatient Hospital Stay
Admission: EM | Admit: 2024-10-11 | Discharge: 2024-10-13 | DRG: 806 | Disposition: A | Attending: Obstetrics and Gynecology | Admitting: Obstetrics and Gynecology

## 2024-10-11 ENCOUNTER — Inpatient Hospital Stay: Admitting: Anesthesiology

## 2024-10-11 ENCOUNTER — Encounter: Payer: Self-pay | Admitting: Licensed Practical Nurse

## 2024-10-11 ENCOUNTER — Other Ambulatory Visit: Payer: Self-pay

## 2024-10-11 ENCOUNTER — Other Ambulatory Visit: Payer: Self-pay | Admitting: Licensed Practical Nurse

## 2024-10-11 DIAGNOSIS — N949 Unspecified condition associated with female genital organs and menstrual cycle: Secondary | ICD-10-CM

## 2024-10-11 DIAGNOSIS — O9081 Anemia of the puerperium: Secondary | ICD-10-CM | POA: Diagnosis not present

## 2024-10-11 DIAGNOSIS — O26893 Other specified pregnancy related conditions, third trimester: Secondary | ICD-10-CM | POA: Diagnosis not present

## 2024-10-11 DIAGNOSIS — Z3490 Encounter for supervision of normal pregnancy, unspecified, unspecified trimester: Secondary | ICD-10-CM

## 2024-10-11 DIAGNOSIS — R109 Unspecified abdominal pain: Secondary | ICD-10-CM | POA: Diagnosis not present

## 2024-10-11 DIAGNOSIS — Z2839 Other underimmunization status: Principal | ICD-10-CM

## 2024-10-11 DIAGNOSIS — O480 Post-term pregnancy: Secondary | ICD-10-CM

## 2024-10-11 DIAGNOSIS — Z79899 Other long term (current) drug therapy: Secondary | ICD-10-CM | POA: Insufficient documentation

## 2024-10-11 DIAGNOSIS — Z3A40 40 weeks gestation of pregnancy: Secondary | ICD-10-CM

## 2024-10-11 DIAGNOSIS — D62 Acute posthemorrhagic anemia: Secondary | ICD-10-CM | POA: Diagnosis not present

## 2024-10-11 DIAGNOSIS — Z6791 Unspecified blood type, Rh negative: Secondary | ICD-10-CM | POA: Diagnosis not present

## 2024-10-11 DIAGNOSIS — Z7722 Contact with and (suspected) exposure to environmental tobacco smoke (acute) (chronic): Secondary | ICD-10-CM | POA: Insufficient documentation

## 2024-10-11 DIAGNOSIS — Z833 Family history of diabetes mellitus: Secondary | ICD-10-CM | POA: Diagnosis not present

## 2024-10-11 DIAGNOSIS — O471 False labor at or after 37 completed weeks of gestation: Secondary | ICD-10-CM | POA: Insufficient documentation

## 2024-10-11 LAB — CBC
HCT: 31.8 % — ABNORMAL LOW (ref 36.0–46.0)
Hemoglobin: 10.7 g/dL — ABNORMAL LOW (ref 12.0–15.0)
MCH: 27.9 pg (ref 26.0–34.0)
MCHC: 33.6 g/dL (ref 30.0–36.0)
MCV: 82.8 fL (ref 80.0–100.0)
Platelets: 227 K/uL (ref 150–400)
RBC: 3.84 MIL/uL — ABNORMAL LOW (ref 3.87–5.11)
RDW: 13.2 % (ref 11.5–15.5)
WBC: 13.2 K/uL — ABNORMAL HIGH (ref 4.0–10.5)
nRBC: 0 % (ref 0.0–0.2)

## 2024-10-11 LAB — SYPHILIS: RPR W/REFLEX TO RPR TITER AND TREPONEMAL ANTIBODIES, TRADITIONAL SCREENING AND DIAGNOSIS ALGORITHM: RPR Ser Ql: NONREACTIVE

## 2024-10-11 MED ORDER — EPHEDRINE 5 MG/ML INJ: 10.0000 mg | INTRAVENOUS | Status: DC | PRN

## 2024-10-11 MED ORDER — OXYTOCIN-SODIUM CHLORIDE 30-0.9 UT/500ML-% IV SOLN
1.0000 m[IU]/min | INTRAVENOUS | Status: DC
  Administered 2024-10-11: 2 m[IU]/min via INTRAVENOUS
  Filled 2024-10-11: qty 500

## 2024-10-11 MED ORDER — LIDOCAINE HCL (PF) 1 % IJ SOLN: 30.0000 mL | INTRAMUSCULAR | Status: DC | PRN

## 2024-10-11 MED ORDER — ZOLPIDEM TARTRATE 5 MG PO TABS
5.0000 mg | ORAL_TABLET | Freq: Every evening | ORAL | Status: DC | PRN
  Administered 2024-10-11: 5 mg via ORAL
  Filled 2024-10-11 (×2): qty 1

## 2024-10-11 MED ORDER — TERBUTALINE SULFATE 1 MG/ML IJ SOLN
0.2500 mg | Freq: Once | INTRAMUSCULAR | Status: AC | PRN
  Administered 2024-10-11: 0.25 mg via SUBCUTANEOUS
  Filled 2024-10-11: qty 1

## 2024-10-11 MED ORDER — OXYTOCIN 10 UNIT/ML IJ SOLN
INTRAMUSCULAR | Status: AC
  Filled 2024-10-11: qty 2

## 2024-10-11 MED ORDER — LACTATED RINGERS IV SOLN: INTRAVENOUS | Status: DC

## 2024-10-11 MED ORDER — ONDANSETRON HCL 4 MG/2ML IJ SOLN
4.0000 mg | Freq: Four times a day (QID) | INTRAMUSCULAR | Status: DC | PRN
  Administered 2024-10-11 (×2): 4 mg via INTRAVENOUS
  Filled 2024-10-11 (×2): qty 2

## 2024-10-11 MED ORDER — LIDOCAINE HCL (PF) 1 % IJ SOLN
INTRAMUSCULAR | Status: DC | PRN
  Administered 2024-10-11: 2 mL

## 2024-10-11 MED ORDER — AMMONIA AROMATIC IN INHA
RESPIRATORY_TRACT | Status: AC
  Filled 2024-10-11: qty 10

## 2024-10-11 MED ORDER — FAMOTIDINE 20 MG PO TABS
20.0000 mg | ORAL_TABLET | Freq: Every day | ORAL | Status: DC | PRN
  Administered 2024-10-11: 20 mg via ORAL
  Filled 2024-10-11: qty 1

## 2024-10-11 MED ORDER — LIDOCAINE-EPINEPHRINE (PF) 1.5 %-1:200000 IJ SOLN
INTRAMUSCULAR | Status: DC | PRN
  Administered 2024-10-11: 3 mL via EPIDURAL

## 2024-10-11 MED ORDER — SOD CITRATE-CITRIC ACID 500-334 MG/5ML PO SOLN: 30.0000 mL | ORAL | Status: DC | PRN

## 2024-10-11 MED ORDER — FENTANYL-BUPIVACAINE-NACL 0.5-0.125-0.9 MG/250ML-% EP SOLN
12.0000 mL/h | EPIDURAL | Status: DC | PRN
  Administered 2024-10-11: 12 mL/h via EPIDURAL
  Filled 2024-10-11: qty 250

## 2024-10-11 MED ORDER — DIPHENHYDRAMINE HCL 50 MG/ML IJ SOLN: 12.5000 mg | INTRAMUSCULAR | Status: DC | PRN

## 2024-10-11 MED ORDER — FENTANYL CITRATE (PF) 100 MCG/2ML IJ SOLN: 50.0000 ug | INTRAMUSCULAR | Status: DC | PRN

## 2024-10-11 MED ORDER — OXYTOCIN BOLUS FROM INFUSION
333.0000 mL | Freq: Once | INTRAVENOUS | Status: AC
  Administered 2024-10-11: 333 mL via INTRAVENOUS

## 2024-10-11 MED ORDER — LIDOCAINE HCL (PF) 1 % IJ SOLN
INTRAMUSCULAR | Status: AC
  Filled 2024-10-11: qty 30

## 2024-10-11 MED ORDER — SODIUM CHLORIDE 0.9 % IV SOLN
25.0000 mg | Freq: Four times a day (QID) | INTRAVENOUS | Status: DC | PRN
  Administered 2024-10-11: 25 mg via INTRAVENOUS
  Filled 2024-10-11: qty 1

## 2024-10-11 MED ORDER — OXYTOCIN-SODIUM CHLORIDE 30-0.9 UT/500ML-% IV SOLN: 2.5000 [IU]/h | INTRAVENOUS | Status: DC

## 2024-10-11 MED ORDER — LACTATED RINGERS IV SOLN
500.0000 mL | Freq: Once | INTRAVENOUS | Status: AC
  Administered 2024-10-11 – 2024-10-12 (×2): 500 mL via INTRAVENOUS

## 2024-10-11 MED ORDER — PHENYLEPHRINE 80 MCG/ML (10ML) SYRINGE FOR IV PUSH (FOR BLOOD PRESSURE SUPPORT): 80.0000 ug | PREFILLED_SYRINGE | INTRAVENOUS | Status: DC | PRN

## 2024-10-11 MED ORDER — LACTATED RINGERS IV SOLN: 500.0000 mL | INTRAVENOUS | Status: DC | PRN

## 2024-10-11 MED ORDER — MISOPROSTOL 200 MCG PO TABS
ORAL_TABLET | ORAL | Status: AC
  Filled 2024-10-11: qty 4

## 2024-10-11 NOTE — Progress Notes (Signed)
  Labor Progress Note   18 y.o. G1P0 @ [redacted]w[redacted]d   Subjective:  Christina Butler is comfortable with her epidural, not feeling contractions. Had some nausea but is improved after receiving zofran .   Objective:  BP 124/69   Pulse (!) 116   Temp 97.9 F (36.6 C) (Oral)   Resp 16   Ht 5' 6 (1.676 m)   Wt 94.3 kg   LMP 01/05/2024 (Exact Date)   SpO2 100%   BMI 33.57 kg/m  Abd: gravid SVE: 5cm/90%/-2 AROM for clear fluid  EFM: 110, mod variability, pos accels, no decels Toco: Ucs q1-48min   Assessment  G1P0 @ [redacted]w[redacted]d early labor VSS FHR Cat I   Plan:   1. Only slight cervical change in made in past 4.5hours, therefore AROM done for augmentation 2. Plan to repeat SVE in 2-3 hours or sooner PRN  All discussed with patient  Lolita Loots CNM, FNP [facility] 10/11/2024  11:43 AM

## 2024-10-11 NOTE — Progress Notes (Signed)
  Labor Progress Note   18 y.o. G1P0 @ [redacted]w[redacted]d   Subjective:  Comfortable with epidural, not feeling contractions. Occasionally feeling pain in left side but it subsides when she lays supine.  Objective:  BP 122/68   Pulse (!) 116   Temp 99.1 F (37.3 C) (Oral)   Resp 16   Ht 5' 6 (1.676 m)   Wt 94.3 kg   LMP 01/05/2024 (Exact Date)   SpO2 100%   BMI 33.57 kg/m  Abd: gravid SVE: 6cm/90%/-2  EFM: 115, mod variability, pos accels, no decels Toco: Ucs q41min   Assessment  G1P0 @ [redacted]w[redacted]d No cervical change in past two hours VSS FHR Cat I   Plan:   1. With no change will start pitocin  for augmentation. Discussed placing IUPC to accurately measure MVUs with contractions only q20min. Christina Butler in agreement with plan.  All discussed with patient  Christina Butler CNM, FNP [facility] 10/11/2024  4:47 PM

## 2024-10-11 NOTE — Progress Notes (Signed)
  Labor Progress Note   18 y.o. G1P0 @ [redacted]w[redacted]d   Subjective:  Has felt some pain on left side of abdomen despite now lying on left side for one hour and pressing epidural button twice. When positioned to back for SVE pain subsided.   Objective:  BP 124/60   Pulse 99   Temp 98.4 F (36.9 C) (Oral)   Resp 16   Ht 5' 6 (1.676 m)   Wt 94.3 kg   LMP 01/05/2024 (Exact Date)   SpO2 100%   BMI 33.57 kg/m  Abd: gravid SVE: 6cm/90%/-2, vertex, caput palpable  EFM: 115, mod variability, pos accels, no decels Toco: Ucs q1.5-31min   Assessment  G1P0 @ [redacted]w[redacted]d Active labor VSS FHR Cat I   Plan:   1. Has made continued slow cervical change. Considered pitocin  augmentation but with contractions q1.5-40min will hold off for now. Plan repeat SVE in two hours. If no change at that point will start pitocin  and place IUPC to accurately measure MVUs.   All discussed with patient  Lolita Loots CNM, FNP [facility] 10/11/2024  2:45 PM

## 2024-10-11 NOTE — Progress Notes (Signed)
  Labor Progress Note   18 y.o. G1P0 @ [redacted]w[redacted]d   Subjective:  Reports feeling increased pelvic pressure  Objective:  BP 125/81   Pulse 96   Temp 98.7 F (37.1 C) (Oral)   Resp 18   Ht 5' 6 (1.676 m)   Wt 94.3 kg   LMP 01/05/2024 (Exact Date)   SpO2 100%   BMI 33.57 kg/m  Abd: gravid SVE: 10cm/100%/+1  EFM: 120, mod variability, pos accels, no decels Toco: Ucs q1-2.44min, Pit at 68mu/min, MVUs 260   Assessment  G1P0 @ [redacted]w[redacted]d complete VSS FHR Cat I   Plan:   1. Will start pushing shortly. Dr. Leonce updated on patient status.   All discussed with patient  Christina Butler CNM, FNP [facility] 10/11/2024  7:56 PM

## 2024-10-11 NOTE — Anesthesia Preprocedure Evaluation (Signed)
 Anesthesia Evaluation  Patient identified by MRN, date of birth, ID band Patient awake    Reviewed: Allergy & Precautions, H&P , NPO status , Patient's Chart, lab work & pertinent test results  Airway Mallampati: II  TM Distance: >3 FB Neck ROM: full    Dental no notable dental hx.    Pulmonary neg pulmonary ROS   Pulmonary exam normal        Cardiovascular Exercise Tolerance: Good negative cardio ROS Normal cardiovascular exam     Neuro/Psych  PSYCHIATRIC DISORDERS Anxiety Depression Bipolar Disorder      GI/Hepatic negative GI ROS,,,  Endo/Other    Renal/GU   negative genitourinary   Musculoskeletal   Abdominal   Peds  Hematology negative hematology ROS (+)   Anesthesia Other Findings Past Medical History: No date: Anxiety No date: Depression  Past Surgical History: No date: TONSILLECTOMY  BMI    Body Mass Index: 33.57 kg/m      Reproductive/Obstetrics (+) Pregnancy                              Anesthesia Physical Anesthesia Plan  ASA: 2  Anesthesia Plan: Epidural   Post-op Pain Management:    Induction:   PONV Risk Score and Plan:   Airway Management Planned:   Additional Equipment:   Intra-op Plan:   Post-operative Plan:   Informed Consent: I have reviewed the patients History and Physical, chart, labs and discussed the procedure including the risks, benefits and alternatives for the proposed anesthesia with the patient or authorized representative who has indicated his/her understanding and acceptance.       Plan Discussed with: Anesthesiologist and CRNA  Anesthesia Plan Comments:         Anesthesia Quick Evaluation

## 2024-10-11 NOTE — Progress Notes (Signed)
 IOL scheduled 12/14 at 0800 orders placed  Jinnie Cookey, [place]  [facility] 10/11/24  1:57 AM

## 2024-10-11 NOTE — H&P (Signed)
 Christina Butler is a 18 y.o. female presenting for contractions. She was evaluated earlier and sent home after being given a dose of Ambien . Christina Butler was able to sleep for about 1.5 hours, then she woke up to more intense contractions. She endorses+FM, denies LOF/VB but did see a mucusy blood clot in the toilet.  She made some cervical change from last night so is admitted for labor management.  Christina Butler started her prenatal care at 13 weeks, she has had consistent care. Her pregnancy has been complicated by Rh negative status and Anemia-PO iron . She does have a history of Bipolar disorder, depression and anxiety, she currently not on any medications.   To note on admission the fetal baseline was noted to be 105-110's, During a previous triage visit she was noted to have a low baseline as well. Pt reports that at many of her ROB's she needed to stay longer for monitoring d/t a low fetal heartrate.   OB History     Gravida  1   Para      Term      Preterm      AB      Living         SAB      IAB      Ectopic      Multiple      Live Births             Past Medical History:  Diagnosis Date   Anxiety    Depression    Past Surgical History:  Procedure Laterality Date   TONSILLECTOMY     Family History: family history includes Diabetes in her mother; Healthy in her father. Social History:  reports that she has never smoked. She has been exposed to tobacco smoke. She has never used smokeless tobacco. She reports that she does not drink alcohol and does not use drugs.     Maternal Diabetes: No Genetic Screening: Normal Maternal Ultrasounds/Referrals: Normal Fetal Ultrasounds or other Referrals:  None Maternal Substance Abuse:  No Significant Maternal Medications:  None Significant Maternal Lab Results:  Group B Strep negative and Rh negative Number of Prenatal Visits:greater than 3 verified prenatal visits Maternal Vaccinations:RSV: Given during pregnancy >/=14 days ago,  TDap, and Flu Other Comments:  None  Review of Systems History Dilation: 4 Effacement (%): 90 Exam by:: Christina Butler Last menstrual period 01/05/2024. Maternal Exam:  Introitus: Normal vulva. Vagina is negative for discharge.    Physical Exam Constitutional:      Appearance: Normal appearance.  Cardiovascular:     Rate and Rhythm: Normal rate.  Pulmonary:     Effort: Pulmonary effort is normal.  Abdominal:     Tenderness: There is no abdominal tenderness.     Comments: Gravid, EFW 7.5lbs Vertex by cervical exam   Genitourinary:    General: Normal vulva.     Vagina: No vaginal discharge.  Musculoskeletal:        General: Normal range of motion.     Cervical back: Normal range of motion.  Skin:    General: Skin is warm.  Neurological:     General: No focal deficit present.     Mental Status: She is alert.  Psychiatric:        Mood and Affect: Mood normal.        Thought Content: Thought content normal.    EFM: baseline 110, moderate variability, pos accel, neg decel  TOOC: q 1-3    Prenatal labs: ABO, Rh: --/--/PENDING (12/07  9370) Antibody: PENDING (12/07 0629) Rubella: 1.04 (05/27 1541) RPR: Non Reactive (09/16 1014)  HBsAg: Negative (05/27 1541)  HIV: Non Reactive (09/16 1014)  GBS: Negative/-- (11/13 1645)   Assessment/Plan: G1P0 at 40weeks admitted in early labor.  -Pt given terb for frequent and long contractions with low baseline.  -Labs ordered  -Category II tracing,   -Planning epidural  -Anticipate SVB  Dr Leonce called, aware of admission and plan   Christina Butler, CNM  [facility] 10/11/2024 7:16 AM    Christina Butler Christina Butler 10/11/2024, 7:00 AM

## 2024-10-11 NOTE — Discharge Summary (Signed)
 Physician Final Progress Note  Patient ID: Christina Butler MRN: [id number] DOB/AGE: May 28, 2006 18 y.o.  Admit date: 10/11/2024 Admitting provider: Garnette JONETTA Mace, MD Discharge date: 10/11/2024   Admission Diagnoses:  1) intrauterine pregnancy at [redacted]w[redacted]d  2) contractions  Discharge Diagnoses:  Active Problems:   Indication for care in labor and delivery, antepartum  Not in labor   History of Present Illness: The patient is a 18 y.o. female G1P0 at 105w0d who presents for contractions. Christina Butler has been contracting on and off for weeks, but she has had more consistent  contractions since 5 this morning. Around 10pm the contractions became more intense and she had pelvic pressure that felt like the baby was going to fall out. She endorses +FM denies LOF/VB.    Past Medical History:  Diagnosis Date   Anxiety    Depression     Past Surgical History:  Procedure Laterality Date   TONSILLECTOMY      No current facility-administered medications on file prior to encounter.   Current Outpatient Medications on File Prior to Encounter  Medication Sig Dispense Refill   docusate sodium  (COLACE) 100 MG capsule Take 1 capsule (100 mg total) by mouth 2 (two) times daily as needed. 30 capsule 2   ferrous sulfate  325 (65 FE) MG EC tablet Take 1 tablet (325 mg total) by mouth daily. 90 tablet 1   ondansetron  (ZOFRAN ) 4 MG tablet TAKE 1 TABLET(4 MG) BY MOUTH EVERY 8 HOURS AS NEEDED FOR NAUSEA OR VOMITING 20 tablet 0   Prenatal Vit-Fe Fumarate-FA (PRENATAL PO) Take by mouth.      Allergies  Allergen Reactions   Iodine Diarrhea and Nausea Only   Iodinated Contrast Media Nausea And Vomiting    Social History   Socioeconomic History   Marital status: Significant Other    Spouse name: Brycen   Number of children: 0   Years of education: Not on file   Highest education level: Some college, no degree  Occupational History   Occupation: Biomedical Scientist, Conservation Officer, [employer]  Tobacco Use   Smoking status:  Never    Passive exposure: Yes   Smokeless tobacco: Never   Tobacco comments:    father smokes outside and in the car with the windows down  Vaping Use   Vaping status: Former   Substances: Nicotine   Substance and Sexual Activity   Alcohol use: No   Drug use: Never   Sexual activity: Yes    Partners: Male    Birth control/protection: None    Comment: undecided  Other Topics Concern   Not on file  Social History Narrative   Not on file   Social Drivers of Health   Financial Resource Strain: Low Risk  (03/06/2024)   Overall Financial Resource Strain (CARDIA)    Difficulty of Paying Living Expenses: Not hard at all  Food Insecurity: No Food Insecurity (03/06/2024)   Hunger Vital Sign    Worried About Running Out of Food in the Last Year: Never true    Ran Out of Food in the Last Year: Never true  Transportation Needs: No Transportation Needs (03/06/2024)   PRAPARE - Administrator, Civil Service (Medical): No    Lack of Transportation (Non-Medical): No  Physical Activity: Sufficiently Active (03/06/2024)   Exercise Vital Sign    Days of Exercise per Week: 4 days    Minutes of Exercise per Session: 60 min  Stress: No Stress Concern Present (03/06/2024)   [employer] of Occupational Health - Occupational  Stress Questionnaire    Feeling of Stress : Only a little  Social Connections: Moderately Isolated (03/06/2024)   Social Connection and Isolation Panel    Frequency of Communication with Friends and Family: Three times a week    Frequency of Social Gatherings with Friends and Family: More than three times a week    Attends Religious Services: Never    Database Administrator or Organizations: No    Attends Engineer, Structural: Not on file    Marital Status: Living with partner  Intimate Partner Violence: Not At Risk (03/18/2024)   Received from [facility]   Humiliation, Afraid, Rape, and Kick questionnaire    Within the last year, have you been afraid  of your partner or ex-partner?: No    Within the last year, have you been humiliated or emotionally abused in other ways by your partner or ex-partner?: No    Within the last year, have you been kicked, hit, slapped, or otherwise physically hurt by your partner or ex-partner?: No    Within the last year, have you been raped or forced to have any kind of sexual activity by your partner or ex-partner?: No    Family History  Problem Relation Age of Onset   Diabetes Mother    Healthy Father      ROS see HPI   Physical Exam: BP 112/70   Pulse (!) 110   Temp 98 F (36.7 C) (Oral)   LMP 01/05/2024 (Exact Date)   SpO2 100%   Physical Exam Constitutional:      Appearance: Normal appearance.  Genitourinary:     Genitourinary Comments: Dilation: 3.5 Effacement (%): 70 Cervical Position: Posterior Station: -2 Exam by:: D Burns, RN   Cardiovascular:     Rate and Rhythm: Normal rate.  Abdominal:     Tenderness: There is no abdominal tenderness.     Comments: gravid  Musculoskeletal:     Comments: Trace BLE   Neurological:     General: No focal deficit present.     Mental Status: She is alert.  Skin:    General: Skin is warm.  Psychiatric:        Mood and Affect: Mood normal.        Thought Content: Thought content normal.   EFM: baseline 120, moderate variability, pos accel, neg decel  TOCO: rare contraction   Consults: None  Significant Findings/ Diagnostic Studies: NA  Procedures: RNST  Hospital Course: The patient was admitted to Labor and Delivery Triage for observation. Her cervix was unchanged from her last exam. She was offered to go home or be rechecked in 2 hours. [name] desired to go home, she asked for the medication that helped her sleep  last time. 10mg  Ambien  was given. Christina Butler did have a ROB last week and does not yet have one scheduled this week. She is scheduled for a postdates IOL on 12/14 at 0800. She was instructed to call the office and schedule a ROB.    Discharge Condition: good  Disposition:  Discharge disposition: 01-Home or Self Care       Diet: Regular diet  Discharge Activity: Activity as tolerated   Allergies as of 10/11/2024       Reactions   Iodine Diarrhea, Nausea Only   Iodinated Contrast Media Nausea And Vomiting        Medication List     TAKE these medications    docusate sodium  100 MG capsule Commonly known as: COLACE Take  1 capsule (100 mg total) by mouth 2 (two) times daily as needed.   ferrous sulfate  325 (65 FE) MG EC tablet Take 1 tablet (325 mg total) by mouth daily.   ondansetron  4 MG tablet Commonly known as: ZOFRAN  TAKE 1 TABLET(4 MG) BY MOUTH EVERY 8 HOURS AS NEEDED FOR NAUSEA OR VOMITING   PRENATAL PO Take by mouth.         Total time spent taking care of this patient: 20 minutes  Signed: JINNIE HERO [facility], CNM  10/11/2024, 1:52 AM

## 2024-10-11 NOTE — OB Triage Note (Signed)
 Pt Christina Butler 18 y.o. presents to labor and delivery triage reporting  contractions started this morning around 0530 this morning but became more intense around 11pm.  Pt is a G1P0 at [redacted]w[redacted]d . Pt denies signs and symptons consistent with rupture of membranes or active vaginal bleeding. Pt denies contractions and states positive fetal movement. External FM and TOCO applied to non-tender abdomen and assessing. Initial FHR 115 . Vital signs obtained and within normal limits. Provider notified of pt.

## 2024-10-11 NOTE — Anesthesia Procedure Notes (Signed)
 Epidural Patient location during procedure: OB Start time: 10/11/2024 9:07 AM End time: 10/11/2024 9:20 AM  Staffing Anesthesiologist: Vicci Camellia Glatter, MD Performed: anesthesiologist   Preanesthetic Checklist Completed: patient identified, IV checked, site marked, risks and benefits discussed, surgical consent, monitors and equipment checked, pre-op evaluation and timeout performed  Epidural Patient position: sitting Prep: ChloraPrep Patient monitoring: heart rate, continuous pulse ox and blood pressure Approach: midline Location: L3-L4 Injection technique: LOR saline  Needle:  Needle type: Tuohy  Needle gauge: 18 G Needle length: 9 cm Needle insertion depth: 7 cm Catheter type: closed end Catheter size: 20 Guage Catheter at skin depth: 12 cm Test dose: negative and 1.5% lidocaine  with Epi 1:200 K  Assessment Events: blood not aspirated, no cerebrospinal fluid, injection not painful, no injection resistance and no paresthesia  Additional Notes Reason for block:procedure for pain

## 2024-10-12 LAB — PREPARE RBC (CROSSMATCH)

## 2024-10-12 LAB — CBC
HCT: 25.8 % — ABNORMAL LOW (ref 36.0–46.0)
HCT: 27.7 % — ABNORMAL LOW (ref 36.0–46.0)
Hemoglobin: 8.5 g/dL — ABNORMAL LOW (ref 12.0–15.0)
Hemoglobin: 9 g/dL — ABNORMAL LOW (ref 12.0–15.0)
MCH: 27.6 pg (ref 26.0–34.0)
MCH: 28.3 pg (ref 26.0–34.0)
MCHC: 32.5 g/dL (ref 30.0–36.0)
MCHC: 32.9 g/dL (ref 30.0–36.0)
MCV: 83.8 fL (ref 80.0–100.0)
MCV: 87.1 fL (ref 80.0–100.0)
Platelets: 183 K/uL (ref 150–400)
Platelets: 193 K/uL (ref 150–400)
RBC: 3.08 MIL/uL — ABNORMAL LOW (ref 3.87–5.11)
RBC: 3.18 MIL/uL — ABNORMAL LOW (ref 3.87–5.11)
RDW: 13.3 % (ref 11.5–15.5)
RDW: 13.5 % (ref 11.5–15.5)
WBC: 17.3 K/uL — ABNORMAL HIGH (ref 4.0–10.5)
WBC: 20.2 K/uL — ABNORMAL HIGH (ref 4.0–10.5)
nRBC: 0 % (ref 0.0–0.2)
nRBC: 0 % (ref 0.0–0.2)

## 2024-10-12 MED ORDER — SIMETHICONE 80 MG PO CHEW: 80.0000 mg | CHEWABLE_TABLET | ORAL | Status: DC | PRN

## 2024-10-12 MED ORDER — FERROUS SULFATE 325 (65 FE) MG PO TABS: 325.0000 mg | ORAL_TABLET | Freq: Every day | ORAL | Status: DC

## 2024-10-12 MED ORDER — EPINEPHRINE 0.3 MG/0.3ML IJ SOAJ: 0.3000 mg | Freq: Once | INTRAMUSCULAR | Status: DC | PRN

## 2024-10-12 MED ORDER — COCONUT OIL OIL: 1.0000 | TOPICAL_OIL | Status: DC | PRN

## 2024-10-12 MED ORDER — TETANUS-DIPHTH-ACELL PERTUSSIS 5-2-15.5 LF-MCG/0.5 IM SUSP
0.5000 mL | Freq: Once | INTRAMUSCULAR | Status: DC
  Filled 2024-10-12: qty 0.5

## 2024-10-12 MED ORDER — WITCH HAZEL-GLYCERIN EX PADS
1.0000 | MEDICATED_PAD | CUTANEOUS | Status: DC | PRN
  Administered 2024-10-13: 1 via TOPICAL
  Filled 2024-10-12 (×3): qty 100

## 2024-10-12 MED ORDER — SODIUM CHLORIDE 0.9 % IV BOLUS: 500.0000 mL | Freq: Once | INTRAVENOUS | Status: DC | PRN

## 2024-10-12 MED ORDER — IRON SUCROSE 500 MG IVPB - SIMPLE MED
500.0000 mg | Freq: Once | INTRAVENOUS | Status: AC
  Administered 2024-10-12: 500 mg via INTRAVENOUS
  Filled 2024-10-12: qty 500

## 2024-10-12 MED ORDER — ACETAMINOPHEN 325 MG PO TABS
650.0000 mg | ORAL_TABLET | ORAL | Status: DC | PRN
  Administered 2024-10-12: 650 mg via ORAL
  Filled 2024-10-12: qty 2

## 2024-10-12 MED ORDER — MAGNESIUM HYDROXIDE 400 MG/5ML PO SUSP: 30.0000 mL | ORAL | Status: DC | PRN

## 2024-10-12 MED ORDER — SODIUM CHLORIDE 0.9% IV SOLUTION: Freq: Once | INTRAVENOUS | Status: AC

## 2024-10-12 MED ORDER — LACTATED RINGERS IV BOLUS
1000.0000 mL | Freq: Once | INTRAVENOUS | Status: AC
  Administered 2024-10-12: 1000 mL via INTRAVENOUS

## 2024-10-12 MED ORDER — METHYLPREDNISOLONE SODIUM SUCC 125 MG IJ SOLR
125.0000 mg | Freq: Once | INTRAMUSCULAR | Status: DC | PRN
  Filled 2024-10-12: qty 2

## 2024-10-12 MED ORDER — ZOLPIDEM TARTRATE 5 MG PO TABS: 5.0000 mg | ORAL_TABLET | Freq: Every evening | ORAL | Status: DC | PRN

## 2024-10-12 MED ORDER — PRENATAL MULTIVITAMIN CH
1.0000 | ORAL_TABLET | Freq: Every day | ORAL | Status: DC
  Administered 2024-10-12 – 2024-10-13 (×2): 1 via ORAL
  Filled 2024-10-12 (×3): qty 1

## 2024-10-12 MED ORDER — SENNOSIDES-DOCUSATE SODIUM 8.6-50 MG PO TABS
2.0000 | ORAL_TABLET | Freq: Every day | ORAL | Status: DC
  Administered 2024-10-13: 2 via ORAL
  Filled 2024-10-12: qty 2

## 2024-10-12 MED ORDER — FERROUS SULFATE 325 (65 FE) MG PO TABS
325.0000 mg | ORAL_TABLET | Freq: Two times a day (BID) | ORAL | Status: DC
  Administered 2024-10-12 – 2024-10-13 (×3): 325 mg via ORAL
  Filled 2024-10-12 (×3): qty 1

## 2024-10-12 MED ORDER — IBUPROFEN 600 MG PO TABS
600.0000 mg | ORAL_TABLET | Freq: Four times a day (QID) | ORAL | Status: DC
  Administered 2024-10-12 – 2024-10-13 (×6): 600 mg via ORAL
  Filled 2024-10-12 (×7): qty 1

## 2024-10-12 MED ORDER — ONDANSETRON HCL 4 MG PO TABS
4.0000 mg | ORAL_TABLET | ORAL | Status: DC | PRN
  Administered 2024-10-12: 4 mg via ORAL
  Filled 2024-10-12: qty 1

## 2024-10-12 MED ORDER — BENZOCAINE-MENTHOL 20-0.5 % EX AERO
1.0000 | INHALATION_SPRAY | CUTANEOUS | Status: DC | PRN
  Filled 2024-10-12: qty 56

## 2024-10-12 MED ORDER — DIPHENHYDRAMINE HCL 50 MG/ML IJ SOLN: 25.0000 mg | Freq: Once | INTRAMUSCULAR | Status: DC | PRN

## 2024-10-12 MED ORDER — ALBUTEROL SULFATE (2.5 MG/3ML) 0.083% IN NEBU: 2.5000 mg | INHALATION_SOLUTION | Freq: Once | RESPIRATORY_TRACT | Status: DC | PRN

## 2024-10-12 MED ORDER — DIBUCAINE (PERIANAL) 1 % EX OINT
1.0000 | TOPICAL_OINTMENT | CUTANEOUS | Status: DC | PRN
  Filled 2024-10-12: qty 28

## 2024-10-12 MED ORDER — SODIUM CHLORIDE 0.9 % IV SOLN: INTRAVENOUS | Status: AC | PRN

## 2024-10-12 MED ORDER — DIPHENHYDRAMINE HCL 25 MG PO CAPS: 25.0000 mg | ORAL_CAPSULE | Freq: Four times a day (QID) | ORAL | Status: DC | PRN

## 2024-10-12 MED ORDER — ONDANSETRON HCL 4 MG/2ML IJ SOLN: 4.0000 mg | INTRAMUSCULAR | Status: DC | PRN

## 2024-10-12 NOTE — Anesthesia Postprocedure Evaluation (Signed)
 Anesthesia Post Note  Patient: Christina Butler  Procedure(s) Performed: AN AD HOC LABOR EPIDURAL  Patient location during evaluation: Mother Baby Anesthesia Type: Epidural Level of consciousness: awake and alert Pain management: pain level controlled Vital Signs Assessment: post-procedure vital signs reviewed and stable Respiratory status: spontaneous breathing, nonlabored ventilation and respiratory function stable Cardiovascular status: stable Postop Assessment: no headache, no backache and epidural receding Anesthetic complications: no   No notable events documented.   Last Vitals:  Vitals:   10/12/24 0615 10/12/24 0625  BP: (!) 103/44 (!) 101/42  Pulse: (!) 101 97  Resp:    Temp:    SpO2:      Last Pain:  Vitals:   10/12/24 0455  TempSrc: Oral  PainSc:                  Hibba Schram B Sable Knoles

## 2024-10-12 NOTE — Progress Notes (Signed)
 0345 Attempted to assist patient to the bathroom after her Iron  infusion, used STEADY and another RN came to bedside to help. While traveling on the steady patient became increasingly lightheaded and RN noticed complexion had paled. Pt assisted back to bed and IV fluid bolus started. Blood pressure was 70s/30s. CNM aware see orders.  9564 Patient feeling good as long as she is laying down, blood pressure increasing but still soft. CNM aware.

## 2024-10-12 NOTE — Progress Notes (Signed)
 [facility] Subjective:  She has been symptomatic from blood loss at time of delivery- dizzy, lightheaded, nausea, tachycardic. 1 unit PRBC ordered and now transfusing. Starting to feel better/more energy. Tolerating PO intake and pain is controlled with PO medications. Currently sitting on bedpan attempting to void. Will continue to monitor. She has not been able to hold newborn due to symptoms. She expresses some disappointment regarding not breastfeeding. Encouraged regarding potential pumping/nursing once she feels better.   Objective:  Vital signs in last 24 hours: Temp:  [97.7 F (36.5 C)-99.1 F (37.3 C)] 98.2 F (36.8 C) (12/08 1019) Pulse Rate:  [88-199] 107 (12/08 1019) Resp:  [18] 18 (12/08 1004) BP: (70-132)/(36-82) 124/71 (12/08 1019) SpO2:  [92 %-100 %] 100 % (12/08 1019)    General: NAD Pulmonary: no increased work of breathing Abdomen: non-distended, non-tender, fundus firm at level of umbilicus Extremities: no edema, no erythema, no tenderness  Results for orders placed or performed during the hospital encounter of 10/11/24 (from the past 72 hours)  CBC     Status: Abnormal   Collection Time: 10/11/24  6:29 AM  Result Value Ref Range   WBC 13.2 (H) 4.0 - 10.5 K/uL   RBC 3.84 (L) 3.87 - 5.11 MIL/uL   Hemoglobin 10.7 (L) 12.0 - 15.0 g/dL   HCT 68.1 (L) 63.9 - 53.9 %   MCV 82.8 80.0 - 100.0 fL   MCH 27.9 26.0 - 34.0 pg   MCHC 33.6 30.0 - 36.0 g/dL   RDW 86.7 88.4 - 84.4 %   Platelets 227 150 - 400 K/uL   nRBC 0.0 0.0 - 0.2 %    Comment: Performed at [facility], [address] [place], [place] [address]  Type and screen     Status: None (Preliminary result)   Collection Time: 10/11/24  6:29 AM  Result Value Ref Range   ABO/RH(D) A NEG    Antibody Screen NEG    Sample Expiration 10/14/2024,2359    Unit Number [id number]    Blood Component Type RED CELLS,LR    Unit division 00    Status of Unit ISSUED    Transfusion Status OK TO TRANSFUSE     Crossmatch Result      Compatible Performed at [facility], [address] [place], [place] [address]   RPR     Status: None   Collection Time: 10/11/24  6:29 AM  Result Value Ref Range   RPR Ser Ql NON REACTIVE NON REACTIVE    Comment: Performed at [facility], [address], [place], [place] [address]  CBC     Status: Abnormal   Collection Time: 10/11/24 11:38 PM  Result Value Ref Range   WBC 17.3 (H) 4.0 - 10.5 K/uL   RBC 3.18 (L) 3.87 - 5.11 MIL/uL   Hemoglobin 9.0 (L) 12.0 - 15.0 g/dL   HCT 72.2 (L) 63.9 - 53.9 %   MCV 87.1 80.0 - 100.0 fL   MCH 28.3 26.0 - 34.0 pg   MCHC 32.5 30.0 - 36.0 g/dL   RDW 86.6 88.4 - 84.4 %   Platelets 193 150 - 400 K/uL   nRBC 0.0 0.0 - 0.2 %    Comment: Performed at [facility], [address] [place], [place] [address]  CBC     Status: Abnormal   Collection Time: 10/12/24  7:29 AM  Result Value Ref Range   WBC 20.2 (H) 4.0 - 10.5 K/uL   RBC 3.08 (  L) 3.87 - 5.11 MIL/uL   Hemoglobin 8.5 (L) 12.0 - 15.0 g/dL   HCT 74.1 (L) 63.9 - 53.9 %   MCV 83.8 80.0 - 100.0 fL   MCH 27.6 26.0 - 34.0 pg   MCHC 32.9 30.0 - 36.0 g/dL   RDW 86.4 88.4 - 84.4 %   Platelets 183 150 - 400 K/uL   nRBC 0.0 0.0 - 0.2 %    Comment: Performed at [facility] [address] [place], [place] [address]  Prepare RBC (crossmatch)     Status: None   Collection Time: 10/12/24  9:37 AM  Result Value Ref Range   Order Confirmation      ORDER PROCESSED BY BLOOD BANK Performed at [facility], [address] [place], [place] [address]     Assessment:   18 y.o. G1P0 postpartum day # 1  Plan:    1) Acute blood loss anemia - hemodynamically stable, clinically significant for this admission S/P iron  infusion 1 unit PRBC transfused - po ferrous sulfate   2) Blood Type --/--/A NEG (12/07 9370) / Rubella 1.04 (05/27 1541) / Varicella Non-Immune  3) TDAP status given antepartum  4) Feeding plan currently formula  feeding and may start pumping  5) Disposition: continue current care   Christina Butler, CNM [facility] 10/12/2024 11:47 AM

## 2024-10-12 NOTE — Progress Notes (Signed)
 1 liter LR bolus received this morning. Pt still feeling dizzy and unable to stand.  Blood transfusion started 1004.

## 2024-10-12 NOTE — Discharge Summary (Signed)
     OB Discharge Summary     Patient Name: Christina Butler DOB: 2006/09/15 MRN: [id number]  Date of admission: 10/11/2024 Delivering MD: Lolita Loots, CNM  Date of Delivery: 10/12/2024  Date of discharge: 10/12/2024  Admitting diagnosis: Encounter for induction of labor [Z34.90] Intrauterine pregnancy: [redacted]w[redacted]d     Secondary diagnosis: Anemia     Discharge diagnosis: Term Pregnancy Delivered, Anemia, and PPH                                                                                                Post partum procedures:{Postpartum procedures:23558}  Augmentation: AROM and Pitocin   Complications: Hemorrhage>1056mL  Hospital course:  Onset of Labor With Vaginal Delivery      18 y.o. yo G1P0 at [redacted]w[redacted]d was admitted in Latent Labor on 10/11/2024. Labor course was complicated by [facility]  Membrane Rupture Time/Date: 11:35 AM,10/11/2024  Delivery Method:Vaginal, Spontaneous Operative Delivery:N/A Episiotomy: None Lacerations:  2nd degree Patient had a postpartum course complicated by ***.  She is ambulating, tolerating a regular diet, passing flatus, and urinating well. Patient is discharged home in stable condition on 10/12/24.  Newborn Data: Birth date:10/11/2024 Birth time:11:06 PM Gender:Female Living status:  Apgars:9 ,9  Weight:   Subjective:   Physical exam  Vitals:   10/11/24 2305 10/11/24 2325 10/11/24 2340 10/11/24 2355  BP:  132/72 126/66 (!) 70/56  Pulse:  (!) 121 (!) 151 (!) 146  Resp:      Temp:      TempSrc:      SpO2: 99% 100% 100% 100%  Weight:      Height:       General: {Exam; general:21111117} Breast: soft, non-tender, nipples without breakdown Lochia: {Desc; appropriate/inappropriate:30686::appropriate} Uterine Fundus: {Desc; firm/soft:30687} Perineum: no erythema or foul odor discharge, minimal edema DVT Evaluation: {Exam; dvt:2111122}  Labs: Lab Results  Component Value Date   WBC 13.2 (H) 10/11/2024   HGB 10.7 (L) 10/11/2024   HCT 31.8 (L)  10/11/2024   MCV 82.8 10/11/2024   PLT 227 10/11/2024    Discharge instruction: in After Visit Summary.  Medications:  Allergies as of 10/12/2024       Reactions   Iodine Diarrhea, Nausea Only   Iodinated Contrast Media Nausea And Vomiting     Med Rec must be completed prior to using this SMARTLINK***        Activity: Advance as tolerated. Pelvic rest for 6 weeks.   Outpatient follow up:     Postpartum contraception: {Contraceptives:21111124} Rhogam Given postpartum: {YES NO:22349} Rubella vaccine given postpartum: not indicated Varicella vaccine given postpartum: {YES NO:22349} TDaP given antepartum or postpartum: ***  Newborn Data: Live born female  Birth Weight:   APGAR: 9, 9  Newborn Delivery   Birth date/time: 10/11/2024 23:06:00 Delivery type: Vaginal, Spontaneous      Baby Feeding: {Baby feeding:23562}  Disposition:{CHL IP OB HOME WITH FNUYZM:76418}   Lolita Loots, CNM, FNP 10/12/2024 12:06 AM

## 2024-10-12 NOTE — Progress Notes (Signed)
 Attempted to get up and go to bathroom with steady. Pt sat on side of bed for a minute and starting feeling dizzy and ears ringing. Unable to stand. Pt back in bed, sipping water and eating snack first time since delivery.

## 2024-10-12 NOTE — Lactation Note (Signed)
 This note was copied from a baby's chart. Lactation Consultation Note  Patient Name: Christina Butler Date: 10/12/2024 Age:20 hours Reason for consult: Initial assessment;Primapara;Term   Maternal Data Initial assessment w/ a 18hr old baby Christina and parents.  This was a SVD.  Blood loss of , primarily from laceration.  Patient w/ a hx of anxiety and depression.  Mom stated that she is interested in exclusively pumping, and did not like breastfeeding earlier.  She has a hand pump and a Mom Cozy pump at home.    Mom did not want to start pumping during this visit.  Feeding Mother's Current Feeding Choice: Breast Milk and Formula (*Exclusively Pump*) Nipple Type: Slow - flow  Interventions Interventions: Breast feeding basics reviewed;Education  LC provided education on the following;  milk production expectations, hunger cues, day 1/2 wet/dirty diapers, benefits of STS and arousing infant for a feeding.  Lactation informed patient of feeding infant at least 8 or more times w/in a 24hr period but not exceeding 3hrs. Patient verbalized understanding.   Report given to RN.  Discharge Pump: Personal;Hands Rollo Farquhar;Manual (Mom Cozy; Hand Pump)  Consult Status Consult Status: Follow-up Date: 10/13/24 Follow-up type: In-patient    Christina Butler 10/12/2024, 5:22 PM

## 2024-10-13 LAB — BPAM RBC
Blood Product Expiration Date: 202512182359
ISSUE DATE / TIME: 202512080958
Unit Type and Rh: 600

## 2024-10-13 LAB — CBC
HCT: 23.6 % — ABNORMAL LOW (ref 36.0–46.0)
Hemoglobin: 7.7 g/dL — ABNORMAL LOW (ref 12.0–15.0)
MCH: 28.3 pg (ref 26.0–34.0)
MCHC: 32.6 g/dL (ref 30.0–36.0)
MCV: 86.8 fL (ref 80.0–100.0)
Platelets: 154 K/uL (ref 150–400)
RBC: 2.72 MIL/uL — ABNORMAL LOW (ref 3.87–5.11)
RDW: 14.1 % (ref 11.5–15.5)
WBC: 12.4 K/uL — ABNORMAL HIGH (ref 4.0–10.5)
nRBC: 0 % (ref 0.0–0.2)

## 2024-10-13 LAB — TYPE AND SCREEN
ABO/RH(D): A NEG
Antibody Screen: NEGATIVE
Unit division: 0

## 2024-10-13 MED ORDER — VARICELLA VIRUS VACCINE LIVE 1350 PFU/0.5ML IJ SUSR
0.5000 mL | INTRAMUSCULAR | Status: AC | PRN
  Administered 2024-10-13: 0.5 mL via SUBCUTANEOUS
  Filled 2024-10-13 (×4): qty 0.5

## 2024-10-13 MED ORDER — IBUPROFEN 600 MG PO TABS: 600.0000 mg | ORAL_TABLET | Freq: Four times a day (QID) | ORAL | 1 refills | Status: AC

## 2024-10-13 MED ORDER — FERROUS SULFATE 325 (65 FE) MG PO TBEC: 325.0000 mg | DELAYED_RELEASE_TABLET | ORAL | 3 refills | Status: AC

## 2024-10-13 MED ORDER — WITCH HAZEL-GLYCERIN EX PADS: 1.0000 | MEDICATED_PAD | CUTANEOUS | 12 refills | Status: DC | PRN

## 2024-10-13 MED ORDER — COCONUT OIL OIL: 1.0000 | TOPICAL_OIL | Status: AC | PRN

## 2024-10-13 MED ORDER — BENZOCAINE-MENTHOL 20-0.5 % EX AERO: 1.0000 | INHALATION_SPRAY | CUTANEOUS | 1 refills | Status: DC | PRN

## 2024-10-13 MED ORDER — ACETAMINOPHEN 325 MG PO TABS: 650.0000 mg | ORAL_TABLET | ORAL | Status: DC | PRN

## 2024-10-13 MED ORDER — DOCUSATE SODIUM 100 MG PO CAPS: 100.0000 mg | ORAL_CAPSULE | Freq: Two times a day (BID) | ORAL | 2 refills | Status: AC

## 2024-10-13 MED ORDER — DIBUCAINE (PERIANAL) 1 % EX OINT: 1.0000 | TOPICAL_OINTMENT | CUTANEOUS | 1 refills | Status: DC | PRN

## 2024-10-13 NOTE — Progress Notes (Signed)
 Pt discharged with infant.  Discharge instructions, prescriptions and follow up appointment given to and reviewed with pt. Pt verbalized understanding. Escorted out by auxillary.

## 2024-10-13 NOTE — Lactation Note (Signed)
 This note was copied from a baby's chart. Lactation Consultation Note  Patient Name: Christina Butler Date: 10/13/2024 Age:20 hours Reason for consult: Follow-up assessment   Maternal Data  Follow up consultation with a P1 mother and 9 hrs old baby Christina. FOB and adult female support person also present in the room.   Feeding Mother's Current Feeding Choice: Formula Nipple Type: Slow - flow  Mother and support person expressed that her feeding choice was now bottle feeding formula and she no longer desires to pump. LC validated her decision.   Interventions Interventions: Education  LC provided education on milk cessation guidelines: cold compresses, cabbage leaves, hand express to comfort, take anti-inflammatories as directed, and avoid stimulating the breasts/nipples.   Discharge Discharge Education: Engorgement and breast care;Outpatient recommendation  LC advised that leaking and engorgement may occur for the next few days. LC discussed breast care, mastitis protocol, and warning signs. LC recommended outpatient lactation if the patient has any future concerns with breast care. Patient verbalized understanding and all questions were answered.  Consult Status Consult Status: Complete Follow-up type: Call as needed   Virgel Haro 10/13/2024, 3:10 PM

## 2024-10-21 ENCOUNTER — Telehealth: Admitting: Certified Nurse Midwife

## 2024-10-21 ENCOUNTER — Encounter: Payer: Self-pay | Admitting: Certified Nurse Midwife

## 2024-10-21 MED ORDER — SERTRALINE HCL 50 MG PO TABS: 50.0000 mg | ORAL_TABLET | Freq: Every day | ORAL | 0 refills | Status: DC

## 2024-10-22 NOTE — Progress Notes (Signed)
° °  Virtual Visit via Video Note   I connected with Christina Butler  on 10/22/2024 4:29 PM by a video enabled telemedicine application and verified that I am speaking with the correct person using two identifiers.   Location: Patient: Christina Butler Provider: [facility]   I discussed the limitations of evaluation and management by telemedicine and the availability of in person appointments. The patient expressed understanding and agreed to proceed. Subjective:    Subjective  Christina Butler is a 18 y.o. female who presents for a postpartum visit. She is2 weeks  postpartum following a spontaneous vaginal delivery. I have fully reviewed the prenatal and intrapartum course. The delivery was at [redacted] weeks gestational age.  Anesthesia: epidural.    Postpartum course has been normal. She has no pain and no other concerns. Baby's course has been normal. Baby is feeding by  breast. Bleeding thin lochia. Bowel function is normal. Bladder function is normal.  .  Contraception method: none.      Review of Systems Pertinent positives noted in HPI. Remainder of comprehensive ROS otherwise negative.     Objective:      Objective  There were no vitals taken for this visit.  General:  alert, cooperative, and no distress        Assessment:    1. Encounter for screening for maternal depression - Screening Negative today, however Christina Butler feels as though her anxiety is getting worse at night. She requests to restart her zoloft . Rx sent.  Will rescreen at 6 week postpartum visit.     2. Postpartum state - Overall doing well. Continue routine postpartum home care.        Plan:      Plan -Begin Zoloft  -Follow up at 6 weeks or sooner if needed    I discussed the assessment and treatment plan with the patient. The patient was provided an opportunity to ask questions and all were answered. The patient agreed with the plan and demonstrated an understanding of the instructions.   The patient was  advised to call back or seek an in-person evaluation if the symptoms worsen or if the condition fails to improve as anticipated.   I provided 15 minutes of non-face-to-face time during this encounter.     Damien PARSLEY, CNM

## 2024-11-17 NOTE — Progress Notes (Unsigned)
 SUBJECTIVE  Christina Butler is a 19 y.o. G1P1001 who gave birth to a female infant at 46 weeks via SVD on 10/11/24 with Lolita Loots, CNM.  She is {Breast or Bottle:[phone]}. She is here today for a PP follow-up visit.   ROS {Ros; complete female:19594}  Mood    Bowel function: *** Bladder function: *** Vaginal bleeding: *** Pain: *** Intercourse: ***  Denies difficulty breathing, chest pain, lower extremity pain or swelling, excessive vaginal bleeding, vaginal pain.   OBJECTIVE  There is no height or weight on file to calculate BMI.  There were no vitals taken for this visit. Iodine and Iodinated contrast media  Past Medical/Surgical History Past Medical History:  Diagnosis Date   Anxiety    Depression    Past Surgical History:  Procedure Laterality Date   TONSILLECTOMY      Last Pap: N/a due to age  {Exam; complete female:17926}  ASSESSMENT/ PLAN 1. Laceration healing: *** 2. PP depression/ anxiety: *** 3. Contraception: *** 4. Pap due 11/ 2026, when she is 19 yo. 5. Reviewed preventive care (dental, eye exam, vaccines, routine screenings).  6. Follow up *** for annual exam or PRN.  Lauraine Lakes, CNM

## 2024-11-18 ENCOUNTER — Encounter: Payer: Self-pay | Admitting: Registered Nurse

## 2024-11-18 ENCOUNTER — Ambulatory Visit: Admitting: Registered Nurse

## 2024-11-18 DIAGNOSIS — Z3009 Encounter for other general counseling and advice on contraception: Secondary | ICD-10-CM | POA: Insufficient documentation

## 2024-11-18 DIAGNOSIS — R300 Dysuria: Secondary | ICD-10-CM | POA: Insufficient documentation

## 2024-11-18 LAB — POCT URINALYSIS DIPSTICK
Bilirubin, UA: NEGATIVE
Blood, UA: NEGATIVE
Glucose, UA: NEGATIVE
Ketones, UA: NEGATIVE
Nitrite, UA: NEGATIVE
Protein, UA: POSITIVE — AB
Spec Grav, UA: 1.015
Urobilinogen, UA: 0.2 U/dL
pH, UA: 5

## 2024-11-18 MED ORDER — NORETHIN ACE-ETH ESTRAD-FE 1-20 MG-MCG(24) PO TABS: 1.0000 | ORAL_TABLET | Freq: Every day | ORAL | 2 refills | Status: AC

## 2024-11-18 MED ORDER — SERTRALINE HCL 50 MG PO TABS: 75.0000 mg | ORAL_TABLET | Freq: Every day | ORAL | 3 refills | Status: AC

## 2024-11-20 LAB — URINE CULTURE
